# Patient Record
Sex: Female | Born: 1960 | Race: White | Hispanic: No | Smoking: Never smoker
Health system: Southern US, Community
[De-identification: ages and names within clinical notes are randomized; demographics above are authoritative.]

## PROBLEM LIST (undated history)

## (undated) DIAGNOSIS — E119 Type 2 diabetes mellitus without complications: Secondary | ICD-10-CM

## (undated) HISTORY — DX: Type 2 diabetes mellitus without complications: E11.9

---

## 2016-09-26 ENCOUNTER — Encounter: Payer: Self-pay | Admitting: Endocrinology

## 2016-09-26 ENCOUNTER — Ambulatory Visit (INDEPENDENT_AMBULATORY_CARE_PROVIDER_SITE_OTHER): Payer: No Typology Code available for payment source | Admitting: Endocrinology

## 2016-09-26 DIAGNOSIS — E1143 Type 2 diabetes mellitus with diabetic autonomic (poly)neuropathy: Secondary | ICD-10-CM

## 2016-09-26 DIAGNOSIS — Z794 Long term (current) use of insulin: Secondary | ICD-10-CM

## 2016-09-26 DIAGNOSIS — E119 Type 2 diabetes mellitus without complications: Secondary | ICD-10-CM | POA: Insufficient documentation

## 2016-09-26 LAB — POCT GLYCOSYLATED HEMOGLOBIN (HGB A1C): Hemoglobin A1C: 10.8

## 2016-09-26 MED ORDER — INSULIN DETEMIR 100 UNIT/ML FLEXPEN: 20.0000 [IU] | PEN_INJECTOR | Freq: Every day | SUBCUTANEOUS | 11 refills | Status: DC

## 2016-09-26 MED ORDER — NOVOLOG FLEXPEN 100 UNIT/ML ~~LOC~~ SOPN: 18.0000 [IU] | PEN_INJECTOR | Freq: Three times a day (TID) | SUBCUTANEOUS | 11 refills | Status: DC

## 2016-09-26 NOTE — Patient Instructions (Addendum)
good diet and exercise significantly improve the control of your diabetes.  please let me know if you wish to be referred to a dietician.  high blood sugar is very risky to your health.  you should see an eye doctor and dentist every year.  It is very important to get all recommended vaccinations.   Controlling your blood pressure and cholesterol drastically reduces the damage diabetes does to your body.  Those who smoke should quit.  Please discuss these with your doctor.   check your blood sugar twice a day.  vary the time of day when you check, between before the 3 meals, and at bedtime.  also check if you have symptoms of your blood sugar being too high or too low.  please keep a record of the readings and bring it to your next appointment here (or you can bring the meter itself).  You can write it on any piece of paper.  please call us sooner if your blood sugar goes below 70, or if you have a lot of readings over 200.   For now, please change the insulins to the numbers noted below.  Please take these numbers no matter what your blood sugar is.  Please call us in a few days, to tell us how the blood sugar is doing.   Please come back for a follow-up appointment in 3 months.

## 2016-09-26 NOTE — Progress Notes (Signed)
Subjective:    Patient ID: Emma Payne, female    DOB: March 05, 1961, 56 y.o.   MRN: [id number]  HPI pt is referred by Evalee Mutton, FNP, for diabetes.  Pt states DM was dx'ed in 2002; she has moderate neuropathy of the lower extremities; she has associated gastroparesis; she has been on insulin x 1-2 months; pt says her diet and exercise are poor; she has never had GDM (G0), pancreatitis, pancreatic surgery, severe hypoglycemia or DKA.  she takes Levemir 20 units qam, and PRN Novolog (averages approx 12-15 per meal).  she brings a record of her cbg's which I have reviewed today.  It varies from 140-400.  She denies hypoglycemia.   Past Medical History:  Diagnosis Date  . Diabetes (HCC)     No past surgical history on file.  Social History   Social History  . Marital status: N/A    Spouse name: N/A  . Number of children: N/A  . Years of education: N/A   Occupational History  . Not on file.   Social History Main Topics  . Smoking status: Not on file  . Smokeless tobacco: Not on file  . Alcohol use Not on file  . Drug use: Unknown  . Sexual activity: Not on file   Other Topics Concern  . Not on file   Social History Narrative  . No narrative on file    No current outpatient prescriptions on file prior to visit.   No current facility-administered medications on file prior to visit.     Allergies  Allergen Reactions  . Aptiom [Eslicarbazepine]     Causes seizures  . Cedax [Ceftibuten]     Causes seizures  . Prednisone     Seizures  . Vimpat [Lacosamide]     Causes seizures    Family History  Problem Relation Age of Onset  . Diabetes Father    BP 132/84   Pulse 95   Ht  (1.575 m)   Wt 175 lb (79.4 kg)   SpO2 95%   BMI 32.01 kg/m   Review of Systems denies weight loss, blurry vision, chest pain, muscle cramps, excessive diaphoresis, and cold intolerance.  She has intermittent headache, frequent urination, DOE, rhinorrhea, easy bruising, and nausea.   Depression is well-controlled.      Objective:   Physical Exam VS: see vs page GEN: no distress HEAD: head: no deformity eyes: no periorbital swelling, no proptosis external nose and ears are normal mouth: no lesion seen NECK: supple, thyroid is not enlarged CHEST WALL: no deformity LUNGS: clear to auscultation CV: reg rate and rhythm, no murmur ABD: abdomen is soft, nontender.  no hepatosplenomegaly.  not distended.  no hernia.  MUSCULOSKELETAL: muscle bulk and strength are grossly normal.  no obvious joint swelling.  gait is steady with a walker EXTEMITIES: no deformity.  no ulcer on the feet.  feet are of normal color and temp.  no edema PULSES: dorsalis pedis intact bilat.  no carotid bruit NEURO:  cn 2-12 grossly intact.   readily moves all 4's.  sensation is intact to touch on the feet, but decreased from normal SKIN:  Normal texture and temperature.  No rash or suspicious lesion is visible.   NODES:  None palpable at the neck PSYCH: alert, well-oriented.  Does not appear anxious nor depressed.   a1c=10.8%  I have reviewed outside records, and summarized: Pt was noted to have severely elevated a1c, and referred here. Dietary problems were addressed.  Glycemic  control was improved, but still poor      Assessment & Plan:  Insulin-requiring type 2 DM, with polyneropathy: she wants to take multiple daily injections,and I agree.    Obesity, new to me: she declines surgery.    Patient Instructions  good diet and exercise significantly improve the control of your diabetes.  please let me know if you wish to be referred to a dietician.  high blood sugar is very risky to your health.  you should see an eye doctor and dentist every year.  It is very important to get all recommended vaccinations.   Controlling your blood pressure and cholesterol drastically reduces the damage diabetes does to your body.  Those who smoke should quit.  Please discuss these with your doctor.   check your  blood sugar twice a day.  vary the time of day when you check, between before the 3 meals, and at bedtime.  also check if you have symptoms of your blood sugar being too high or too low.  please keep a record of the readings and bring it to your next appointment here (or you can bring the meter itself).  You can write it on any piece of paper.  please call us sooner if your blood sugar goes below 70, or if you have a lot of readings over 200.   For now, please change the insulins to the numbers noted below.  Please take these numbers no matter what your blood sugar is.  Please call us in a few days, to tell us how the blood sugar is doing.   Please come back for a follow-up appointment in 3 months.

## 2016-09-29 ENCOUNTER — Telehealth: Payer: Self-pay | Admitting: Endocrinology

## 2016-09-29 NOTE — Telephone Encounter (Signed)
Please continue the same levemir, and: Change the novolog to 3 times a day (just before each meal): 27-15-27 units. Please call us next week, to tell us how the blood sugar is doing

## 2016-09-29 NOTE — Telephone Encounter (Signed)
Patient is calling concerning her b/s. Please advise

## 2016-09-29 NOTE — Telephone Encounter (Signed)
Patient called to report blood sugar. 09/27/2016:  Before breakfast 208 Evening: 231   09/28/2016: Before breakfast: 329 Before lunch: 157 Before Supper: 131 Before Bed: 268  09/29/2016: Fasting: 297  Please advise, Thanks!

## 2016-09-29 NOTE — Telephone Encounter (Signed)
I contacted the patient and advised of new instructions. She voiced understanding and had no other questions at this time. Patient will call next week to report blood sugar readings.

## 2016-10-03 ENCOUNTER — Telehealth: Payer: Self-pay | Admitting: Endocrinology

## 2016-10-04 ENCOUNTER — Other Ambulatory Visit: Payer: Self-pay

## 2016-10-04 MED ORDER — BD PEN NEEDLE NANO U/F 32G X 4 MM MISC: 4 refills | Status: DC

## 2016-10-05 ENCOUNTER — Other Ambulatory Visit: Payer: Self-pay | Admitting: Endocrinology

## 2016-10-06 NOTE — Telephone Encounter (Signed)
BS readings 04/28 before breakfast 297; before lunch 167; before supper 131; before bedtime 153 4/29 364; 309; 467; 343 4/30 379; 190; 119; 178 5/1 233; 130; 132; 111 5/2 253; 132; 128; 437 5/3 223; 113; 111; 152 5/4 156; 102

## 2016-10-06 NOTE — Telephone Encounter (Signed)
I contacted the patient and advised of MD's instructions via voicemail. Requested a call back if the patient would like to discuss further.  

## 2016-10-06 NOTE — Telephone Encounter (Signed)
Sugars are improving >> continue to check over the weekend and let us know.

## 2016-10-13 NOTE — Telephone Encounter (Signed)
Patient is calling to give you her blood sugars readings. From may 1st. Please call

## 2016-10-19 NOTE — Telephone Encounter (Signed)
Left a vm requesting the patient call back with her blood sugar readings

## 2016-10-25 ENCOUNTER — Telehealth: Payer: Self-pay

## 2016-10-25 NOTE — Telephone Encounter (Signed)
Left a voice mail for the patient to call me back so that I can verify what the units are for Novlog

## 2016-10-25 NOTE — Telephone Encounter (Signed)
Patient reporting Blood Sugar results.  May 20th BF Break 198 BF Lunch 144 BF Supp 161 BF Bed 268 May 21st BF Break 240 BF Lunch 235 BF Supp 135 BF Bed 291 May 22nd BF Break 265 BF Lunch 149 BF Supp 133 BF Bed 130  Thank you,  -LLL

## 2016-10-25 NOTE — Telephone Encounter (Signed)
Please verify novolog is 3 times a day (just before each meal): 27-15-27 units. Then increase to 30-18-30 units.

## 2016-10-26 NOTE — Telephone Encounter (Signed)
Gave the advise to the patient and she stated an understanding

## 2016-11-02 ENCOUNTER — Telehealth: Payer: Self-pay

## 2016-11-02 NOTE — Telephone Encounter (Signed)
Please verify novolog is 18 units 3 times a day (just before each meal). then increase to 3 times a day (just before each meal) 18-18-22 units. Please continue the same levemir. I'll see you next time.

## 2016-11-02 NOTE — Telephone Encounter (Signed)
Monday 28th   Morning  Afternoon  Evening   Night        205        136 140    201    Tuesday 29th    Morning Afternoon Evening   Night        262        169 166  327    Wednesday 30th   Morning Afternoon Evening    Night            269 176      106        203  Thank you,  -LL

## 2016-11-03 NOTE — Telephone Encounter (Signed)
Spoke with the patient and she stated an understanding she will call back if she has any further problems

## 2016-11-09 ENCOUNTER — Other Ambulatory Visit: Payer: Self-pay

## 2016-11-09 MED ORDER — NOVOLOG FLEXPEN 100 UNIT/ML ~~LOC~~ SOPN: 18.0000 [IU] | PEN_INJECTOR | Freq: Three times a day (TID) | SUBCUTANEOUS | 0 refills | Status: DC

## 2016-11-09 NOTE — Telephone Encounter (Signed)
Submitted to pharmacy for 90 day 

## 2016-11-09 NOTE — Telephone Encounter (Signed)
**  Remind patient they can make refill requests via MyChart**  Medication refill request (Name & Dosage):  NOVOLOG FLEXPEN 100 UNIT/ML FlexPen  Preferred pharmacy (Name & Address):   [facility] [place] [place] - [id number]  Other comments (if applicable):    Patient requests 90 day supply so that she is not charged hundreds of dollars.

## 2016-11-10 ENCOUNTER — Telehealth: Payer: Self-pay

## 2016-11-10 NOTE — Telephone Encounter (Signed)
Patient called. States that we sent NOVOLOG FLEXPEN 100 UNIT/ML FlexPen [[id number]][[id number]]   to the wrong pharmacy. [facility] [place] [place]   [facility] [place] [address], [place], [place] [id number]  Please submit today for 90 day supply   Patient is also calling in her BS readings 11/06/2016 -272 -166 -165 -216 11/07/2016 -347 -258 -132 -230 11/08/2016 -211 -170 -159 -148 11/09/2016 -234 -178 -123 -174

## 2016-11-13 MED ORDER — NOVOLOG FLEXPEN 100 UNIT/ML ~~LOC~~ SOPN: 18.0000 [IU] | PEN_INJECTOR | Freq: Three times a day (TID) | SUBCUTANEOUS | 0 refills | Status: DC

## 2016-11-13 NOTE — Telephone Encounter (Signed)
I contacted the patient and she sated the patient has been taking her levemir at night. Please advise, Thanks!

## 2016-11-13 NOTE — Telephone Encounter (Signed)
Patient notified and voiced understanding. Will increase the Levemir to 23 units.

## 2016-11-13 NOTE — Telephone Encounter (Signed)
Message not reviewed until 11/13/2016. [facility] [place] Dr. Elvera LennoxGherghe, Could you review the blood sugar readings during Dr. George HughEllison's absence? Thanks!

## 2016-11-13 NOTE — Telephone Encounter (Signed)
Let's increase the dose by 3 units and if sugars in am are high in 3 days, can increase by 3 more if now lows.

## 2016-11-13 NOTE — Telephone Encounter (Signed)
Is there any way she could move the Levemir at night? As the am CBGs are her highest.  If she can do this, the way to transition towards nighttime Levemir is:  if she already took the Levemir in a.m. this morning,  Tomorrow take it at lunchtime and a after tomorrow take it at night (bedtime).

## 2016-11-23 ENCOUNTER — Telehealth: Payer: Self-pay | Admitting: Endocrinology

## 2016-11-23 NOTE — Telephone Encounter (Signed)
Last 3 days of readings 6/18 before breakfast 279; lunch 222; supper 180; bedtime 233 6/19 268; 180; 165; 163 6/20 213; 274; 269; 258 6/21 222; 211

## 2016-11-24 NOTE — Telephone Encounter (Signed)
Requested a call back from the patient to further discuss.  

## 2016-11-24 NOTE — Telephone Encounter (Signed)
Confirm that the patient is taking 20 units of Levemir once a day This needs to be changed to 15 units twice a day

## 2016-11-24 NOTE — Telephone Encounter (Signed)
Requested a call back from the patient to discuss further.  

## 2016-11-27 NOTE — Telephone Encounter (Signed)
Ok, I need to know specifically how blood sugars are doing on 26 units qam.

## 2016-11-27 NOTE — Telephone Encounter (Signed)
Last 3 days of readings 6/18 before breakfast 279; lunch 222; supper 180; bedtime 233 6/19 268; 180; 165; 163 6/20 213; 274; 269; 258 6/21 222; 211  Patient stated these blood sugars were based off the 26 units of levermir daily.

## 2016-11-27 NOTE — Telephone Encounter (Signed)
Patient returning phone call. Call patient back before 11am.

## 2016-11-27 NOTE — Telephone Encounter (Signed)
OK 

## 2016-11-27 NOTE — Telephone Encounter (Signed)
OK, please increase to 28 units qam I'll see you next time.

## 2016-11-27 NOTE — Telephone Encounter (Signed)
I contacted the patient and she stated she is taking 26 units of the Levemir daily.  Please advise on the adjustments to be made. Thanks!

## 2016-11-27 NOTE — Telephone Encounter (Signed)
See instructions from Dr. Lucianne MussKumar and please advise if anything needs to be changed or added to the instructions.

## 2016-11-28 ENCOUNTER — Telehealth: Payer: Self-pay

## 2016-11-28 NOTE — Telephone Encounter (Signed)
Called and LVM advising patient of MD note to increase insulin, left call back number if any questions.

## 2016-11-28 NOTE — Telephone Encounter (Signed)
Called and LVM advising patient of MD note to increase insulin, left call back number if any questions.   

## 2016-11-30 ENCOUNTER — Telehealth: Payer: Self-pay

## 2016-11-30 NOTE — Telephone Encounter (Signed)
Called patient and advised of the medication and insulin changes. Patient understood and had questions at this time.

## 2016-11-30 NOTE — Telephone Encounter (Signed)
Patient returning call about insulin changes. Would like a call back.  Thank you,  -LL

## 2016-11-30 NOTE — Telephone Encounter (Signed)
Called patient and advised of the medication and insulin changes. Patient understood and had questions at this time. 

## 2016-12-18 ENCOUNTER — Telehealth: Payer: Self-pay

## 2016-12-18 NOTE — Telephone Encounter (Signed)
Patient notified of Levemir dosage increase. She voiced understanding and will increase to 35 units at night. Patient stated she does not take her levemir in the am.

## 2016-12-18 NOTE — Telephone Encounter (Signed)
OK, please increase to 35 units qam I'll see you soon, as scheduled

## 2016-12-18 NOTE — Telephone Encounter (Signed)
Friday July 13th  Breakfast   260    Lunch   172    Dinner   182     Bedtime    149  Saturday July 14th  Breakfast   202    Lunch  180     Dinner    232     Bedtime    117  Sunday July 15th  Breakfast   217    Lunch   162    Dinner   207      Bedtime   147   Thank you,  -LL

## 2016-12-18 NOTE — Telephone Encounter (Signed)
See message and please advise, Thanks!  

## 2016-12-22 ENCOUNTER — Other Ambulatory Visit: Payer: Self-pay

## 2016-12-22 ENCOUNTER — Encounter: Payer: Self-pay | Admitting: Endocrinology

## 2016-12-22 ENCOUNTER — Ambulatory Visit (INDEPENDENT_AMBULATORY_CARE_PROVIDER_SITE_OTHER): Payer: No Typology Code available for payment source | Admitting: Endocrinology

## 2016-12-22 VITALS — BP 126/82 | HR 98 | Ht 62.0 in | Wt 176.0 lb

## 2016-12-22 DIAGNOSIS — Z794 Long term (current) use of insulin: Secondary | ICD-10-CM | POA: Diagnosis not present

## 2016-12-22 DIAGNOSIS — E1143 Type 2 diabetes mellitus with diabetic autonomic (poly)neuropathy: Secondary | ICD-10-CM | POA: Diagnosis not present

## 2016-12-22 MED ORDER — INSULIN PEN NEEDLE 32G X 4 MM MISC: 2 refills | Status: DC

## 2016-12-22 MED ORDER — INSULIN DETEMIR 100 UNIT/ML FLEXPEN: 50.0000 [IU] | PEN_INJECTOR | Freq: Every day | SUBCUTANEOUS | 11 refills | Status: DC

## 2016-12-22 MED ORDER — NOVOLOG FLEXPEN 100 UNIT/ML ~~LOC~~ SOPN: PEN_INJECTOR | SUBCUTANEOUS | 11 refills | Status: DC

## 2016-12-22 MED ORDER — NOVOLOG FLEXPEN 100 UNIT/ML ~~LOC~~ SOPN: PEN_INJECTOR | SUBCUTANEOUS | 3 refills | Status: DC

## 2016-12-22 NOTE — Progress Notes (Signed)
Subjective:    Patient ID: Emma Payne, female    DOB: 10/09/1960, 56 y.o.   MRN: [id number]  HPI  Pt returns for f/u of diabetes mellitus: DM type: Insulin-requiring type 2. Dx'ed: 2002 Complications: polyneuropathy and gastroparesis Therapy: insulin since early 2018 GDM: never (G0) DKA: never Severe hypoglycemia: never Pancreatitis: never Pancreatic imaging:  Other: she takes multiple daily injections; she declines weight loss surgery Interval history: she brings a record of her cbg's which I have reviewed today.  from 147-275.  It is lowest at HS, and highest fasting.  He eats at HS.  pt states she feels well in general. Past Medical History:  Diagnosis Date  . Diabetes (HCC)     No past surgical history on file.  Social History   Social History  . Marital status: Divorced    Spouse name: N/A  . Number of children: N/A  . Years of education: N/A   Occupational History  . Not on file.   Social History Main Topics  . Smoking status: Never Smoker  . Smokeless tobacco: Never Used  . Alcohol use No  . Drug use: Unknown  . Sexual activity: Not on file   Other Topics Concern  . Not on file   Social History Narrative  . No narrative on file    Current Outpatient Prescriptions on File Prior to Visit  Medication Sig Dispense Refill  . albuterol (PROVENTIL HFA;VENTOLIN HFA) 108 (90 Base) MCG/ACT inhaler Inhale 2 puffs into the lungs every 6 (six) hours as needed for wheezing or shortness of breath.    . Ascorbic Acid (VITAMIN C) 1000 MG tablet Take 1,000 mg by mouth.    Marland Kitchen. aspirin 81 MG chewable tablet Chew 81 mg by mouth.    . BD INTEGRA SYRINGE 25G X 1" 3 ML MISC     . Biotin 5000 MCG CAPS Take by mouth.    . carbidopa-levodopa (SINEMET IR) 25-100 MG tablet Take by mouth 4 (four) times daily.     . Cholecalciferol (VITAMIN D3) 10000 units TABS Take by mouth.    . CRANBERRY PO Take 25,200 mg by mouth. 2 tabs in the pm    . cyanocobalamin (,VITAMIN B-12,) 1000  MCG/ML injection Inject into the muscle.    . Cyanocobalamin (VITAMIN B 12 PO) Take 3,000 mcg by mouth.    . Dextromethorphan-Quinidine 20-10 MG CAPS 1 in the am 1 at lunch    . dicyclomine (BENTYL) 10 MG capsule TAKE ONE CAPSULE 4 TIMES A DAY AS NEEDED    . FERROUS SULFATE PO Take 65 mg by mouth. 3 times daily    . lamoTRIgine (LAMICTAL) 200 MG tablet 2 (two) times daily.     . Loratadine 10 MG CAPS Take by mouth.    . magnesium chloride (SLOW-MAG) 64 MG TBEC SR tablet Take by mouth. 3 in the am 2 in the evening    . Pediatric Multiple Vit-C-FA (FLINSTONES GUMMIES OMEGA-3 DHA PO) Take 2 tablets by mouth daily.     . ranitidine (ZANTAC) 150 MG tablet Take 150 mg by mouth 2 (two) times daily.    . rizatriptan (MAXALT-MLT) 10 MG disintegrating tablet     . traZODone (DESYREL) 50 MG tablet Take 50-100 mg by mouth.    . valsartan (DIOVAN) 160 MG tablet Take 80 mg by mouth.    . venlafaxine XR (EFFEXOR-XR) 75 MG 24 hr capsule 3 daily in the am    . neomycin-polymyxin b-dexamethasone (MAXITROL) 3.5-10000-0.1 OINT  No current facility-administered medications on file prior to visit.     Allergies  Allergen Reactions  . Aptiom [Eslicarbazepine]     Causes seizures  . Cedax [Ceftibuten]     Causes vomiting  . Prednisone     Seizures  . Vimpat [Lacosamide]     Causes seizures    Family History  Problem Relation Age of Onset  . Diabetes Father     BP 126/82   Pulse 98   Ht 5\' 2"  (1.575 m)   Wt 176 lb (79.8 kg)   SpO2 91%   BMI 32.19 kg/m   Review of Systems She denies hypoglycemia.    Objective:   Physical Exam VITAL SIGNS:  See vs page GENERAL: no distress Pulses: foot pulses are intact bilaterally.   MSK: no deformity of the feet or ankles.  CV: trace bilat edema of the legs Skin:  no ulcer on the feet or ankles.  normal color and temp on the feet and ankles Neuro: sensation is intact to touch on the feet and ankles, but decreased from nornmal  Lab Results    Component Value Date   HGBA1C 7.5 (H) 12/22/2016      Assessment & Plan:  Insulin-requiring type 2 DM, with gastroparesis: she needs increased rx.  Patient Instructions  check your blood sugar twice a day.  vary the time of day when you check, between before the 3 meals, and at bedtime.  also check if you have symptoms of your blood sugar being too high or too low.  please keep a record of the readings and bring it to your next appointment here (or you can bring the meter itself).  You can write it on any piece of paper.  please call us sooner if your blood sugar goes below 70, or if you have a lot of readings over 200.   Please increase the levemir to 50 units at bedtime, and: Please continue the same novolog. Please call or message [place] next week, to tell us how the blood sugar is doing blood tests are requested for you today.  We'll let you know about the results..   Please come back for a follow-up appointment in 3 months.

## 2016-12-22 NOTE — Patient Instructions (Addendum)
check your blood sugar twice a day.  vary the time of day when you check, between before the 3 meals, and at bedtime.  also check if you have symptoms of your blood sugar being too high or too low.  please keep a record of the readings and bring it to your next appointment here (or you can bring the meter itself).  You can write it on any piece of paper.  please call us sooner if your blood sugar goes below 70, or if you have a lot of readings over 200.   Please increase the levemir to 50 units at bedtime, and: Please continue the same novolog. Please call or message us next week, to tell us how the blood sugar is doing blood tests are requested for you today.  We'll let you know about the results..   Please come back for a follow-up appointment in 3 months.

## 2016-12-23 LAB — HEMOGLOBIN A1C
HEMOGLOBIN A1C: 7.5 % — AB (ref <5.7)
MEAN PLASMA GLUCOSE: 169 mg/dL

## 2017-01-30 ENCOUNTER — Telehealth: Payer: Self-pay | Admitting: Endocrinology

## 2017-01-30 NOTE — Telephone Encounter (Signed)
Please verify levemir is 50 units qhs. Then please increase th 60 units qhs.

## 2017-01-30 NOTE — Telephone Encounter (Signed)
Patient's blood sugar is running 270 and she was told to call the office when this happens. Call patient at [phone] to advise.

## 2017-01-30 NOTE — Telephone Encounter (Signed)
please call patient: I need more information about how cbg's run throughout the day.

## 2017-01-30 NOTE — Telephone Encounter (Signed)
CBG's:  Morning Lunch Dinner Bedtime 8/25: 206  193 175 195 8/26: 260  174 122 147    8/27: 274  175 131 135

## 2017-01-31 NOTE — Telephone Encounter (Signed)
Called patient with new dosage & informed her to let us know how blood sugars are doing in a week.

## 2017-03-05 NOTE — Telephone Encounter (Signed)
Please increase levemir to 65 units qhs Please continue the same novolog Please call or message [place] later this week, to tell us how the blood sugar is doing

## 2017-03-05 NOTE — Telephone Encounter (Signed)
Patient calling to give blood sugars: CBG's:             Morning      Lunch  Dinner  Bedtime 9/28:    253               195      284      191 9/29:    300               128      282      170                               9/30:    311               120      198      228 10/1:    258  Please call patient and advise.

## 2017-03-05 NOTE — Telephone Encounter (Signed)
Called patient and she stated that she would call next week with cbg's.

## 2017-03-21 ENCOUNTER — Ambulatory Visit: Payer: No Typology Code available for payment source | Admitting: Endocrinology

## 2017-04-03 ENCOUNTER — Telehealth: Payer: Self-pay | Admitting: Endocrinology

## 2017-04-03 NOTE — Telephone Encounter (Signed)
I called patient & she stated that PCP had recommended her checking into V GO 30. She was fine waiting until appt to discuss.

## 2017-04-03 NOTE — Telephone Encounter (Signed)
Let's discuss at upcoming OV

## 2017-04-03 NOTE — Telephone Encounter (Signed)
Pt is wondering if she could be a candidate for the vgo 30?

## 2017-04-13 ENCOUNTER — Ambulatory Visit: Payer: No Typology Code available for payment source | Admitting: Endocrinology

## 2017-05-08 ENCOUNTER — Encounter: Payer: Self-pay | Admitting: Endocrinology

## 2017-05-08 ENCOUNTER — Ambulatory Visit: Payer: No Typology Code available for payment source | Admitting: Endocrinology

## 2017-05-08 VITALS — BP 138/82 | HR 103 | Wt 181.8 lb

## 2017-05-08 DIAGNOSIS — Z794 Long term (current) use of insulin: Secondary | ICD-10-CM | POA: Diagnosis not present

## 2017-05-08 DIAGNOSIS — E1143 Type 2 diabetes mellitus with diabetic autonomic (poly)neuropathy: Secondary | ICD-10-CM

## 2017-05-08 LAB — POCT GLYCOSYLATED HEMOGLOBIN (HGB A1C): HEMOGLOBIN A1C: 7.9

## 2017-05-08 MED ORDER — INSULIN DETEMIR 100 UNIT/ML FLEXPEN: 75.0000 [IU] | PEN_INJECTOR | Freq: Every day | SUBCUTANEOUS | 11 refills | Status: DC

## 2017-05-08 MED ORDER — NOVOLOG FLEXPEN 100 UNIT/ML ~~LOC~~ SOPN: 18.0000 [IU] | PEN_INJECTOR | Freq: Three times a day (TID) | SUBCUTANEOUS | 3 refills | Status: DC

## 2017-05-08 MED ORDER — INSULIN PEN NEEDLE 32G X 4 MM MISC: 1.0000 | Freq: Four times a day (QID) | 2 refills | Status: DC

## 2017-05-08 NOTE — Progress Notes (Signed)
Subjective:    Patient ID: Emma Payne, female    DOB: 03/06/1961, 56 y.o.   MRN: [id number]  HPI Pt returns for f/u of diabetes mellitus: DM type: Insulin-requiring type 2. Dx'ed: 2002 Complications: polyneuropathy and gastroparesis Therapy: insulin since early 2018 GDM: never (G0) DKA: never Severe hypoglycemia: never.  Pancreatitis: never Pancreatic imaging: never Other: she takes multiple daily injections; she declines weight loss surgery.   Interval history: she brings a record of her cbg's which I have reviewed today.  from 83-300.  It is still lowest at HS, and highest fasting.  He eats at HS.  pt states she feels well in general. Past Medical History:  Diagnosis Date  . Diabetes (HCC)     History reviewed. No pertinent surgical history.  Social History   Socioeconomic History  . Marital status: Divorced    Spouse name: Not on file  . Number of children: Not on file  . Years of education: Not on file  . Highest education level: Not on file  Social Needs  . Financial resource strain: Not on file  . Food insecurity - worry: Not on file  . Food insecurity - inability: Not on file  . Transportation needs - medical: Not on file  . Transportation needs - non-medical: Not on file  Occupational History  . Not on file  Tobacco Use  . Smoking status: Never Smoker  . Smokeless tobacco: Never Used  Substance and Sexual Activity  . Alcohol use: No  . Drug use: Not on file  . Sexual activity: Not on file  Other Topics Concern  . Not on file  Social History Narrative  . Not on file    Current Outpatient Medications on File Prior to Visit  Medication Sig Dispense Refill  . acetaZOLAMIDE (DIAMOX) 250 MG tablet Take 250 mg by mouth 3 (three) times daily. Take 1/2 tablet at bedtime    . albuterol (PROVENTIL HFA;VENTOLIN HFA) 108 (90 Base) MCG/ACT inhaler Inhale 2 puffs into the lungs every 6 (six) hours as needed for wheezing or shortness of breath.    . Ascorbic Acid  (VITAMIN C) 1000 MG tablet Take 1,000 mg by mouth.    Marland Kitchen. aspirin 81 MG chewable tablet Chew 81 mg by mouth.    . BD INTEGRA SYRINGE 25G X 1" 3 ML MISC     . Biotin 5000 MCG CAPS Take by mouth.    . carbidopa-levodopa (SINEMET IR) 25-100 MG tablet Take by mouth 4 (four) times daily.     . Cholecalciferol (VITAMIN D3) 10000 units TABS Take by mouth.    . CRANBERRY PO Take 25,200 mg by mouth. 2 tabs in the pm    . cyanocobalamin (,VITAMIN B-12,) 1000 MCG/ML injection Inject into the muscle.    . Cyanocobalamin (VITAMIN B 12 PO) Take 3,000 mcg by mouth.    . Dextromethorphan-Quinidine 20-10 MG CAPS 1 in the am 1 at lunch    . dicyclomine (BENTYL) 10 MG capsule TAKE ONE CAPSULE 4 TIMES A DAY AS NEEDED    . FERROUS SULFATE PO Take 65 mg by mouth. 3 times daily    . Krill Oil 500 MG CAPS Take by mouth. 1 at night    . lamoTRIgine (LAMICTAL) 200 MG tablet 2 (two) times daily.     Marland Kitchen. levETIRAcetam (KEPPRA) 250 MG tablet Take 250 mg by mouth 2 (two) times daily.    . Loratadine 10 MG CAPS Take by mouth.    . magnesium chloride (  SLOW-MAG) 64 MG TBEC SR tablet Take by mouth. 3 in the am 2 in the evening    . meloxicam (MOBIC) 7.5 MG tablet Take 7.5 mg by mouth daily. 1 in the am    . neomycin-polymyxin b-dexamethasone (MAXITROL) 3.5-10000-0.1 OINT     . Pediatric Multiple Vit-C-FA (FLINSTONES GUMMIES OMEGA-3 DHA PO) Take 2 tablets by mouth daily.     . ranitidine (ZANTAC) 150 MG tablet Take 150 mg by mouth 2 (two) times daily.    . rizatriptan (MAXALT-MLT) 10 MG disintegrating tablet     . tiZANidine (ZANAFLEX) 4 MG capsule Take 4 mg by mouth 2 (two) times daily.    . traZODone (DESYREL) 50 MG tablet Take 50-100 mg by mouth.    . valsartan (DIOVAN) 160 MG tablet Take 80 mg by mouth.    . venlafaxine XR (EFFEXOR-XR) 75 MG 24 hr capsule 3 daily in the am     No current facility-administered medications on file prior to visit.     Allergies  Allergen Reactions  . Aptiom [Eslicarbazepine]     Causes  seizures  . Cedax [Ceftibuten]     Causes vomiting  . Prednisone     Seizures  . Vimpat [Lacosamide]     Causes seizures    Family History  Problem Relation Age of Onset  . Diabetes Father     BP 138/82 (BP Location: Left Arm, Patient Position: Sitting, Cuff Size: Normal)   Pulse (!) 103   Wt 181 lb 12.8 oz (82.5 kg)   SpO2 93%   BMI 33.25 kg/m    Review of Systems She denies hypoglycemia    Objective:   Physical Exam VITAL SIGNS:  See vs page GENERAL: no distress Pulses: foot pulses are intact bilaterally.   MSK: no deformity of the feet or ankles.  CV: no edema of the legs or ankles Skin:  no ulcer on the feet or ankles.  normal color and temp on the feet and ankles Neuro: sensation is intact to touch on the feet and ankles.     Lab Results  Component Value Date   HGBA1C 7.9 05/08/2017      Assessment & Plan:  Insulin-requiring type 2 DM: The pattern of his cbg's indicates she needs some adjustment in his therapy   Patient Instructions  check your blood sugar twice a day.  vary the time of day when you check, between before the 3 meals, and at bedtime.  also check if you have symptoms of your blood sugar being too high or too low.  please keep a record of the readings and bring it to your next appointment here (or you can bring the meter itself).  You can write it on any piece of paper.  please call us sooner if your blood sugar goes below 70, or if you have a lot of readings over 200.   Please increase the levemir to 75 units at bedtime, and:  Please reduce the novolog to 18 units 3 times a day (just before each meal). Please see Bonita QuinLinda, to consider an insulin pump and continuous glucose monitor.  Please come back for a follow-up appointment in 3 months.

## 2017-05-08 NOTE — Patient Instructions (Addendum)
check your blood sugar twice a day.  vary the time of day when you check, between before the 3 meals, and at bedtime.  also check if you have symptoms of your blood sugar being too high or too low.  please keep a record of the readings and bring it to your next appointment here (or you can bring the meter itself).  You can write it on any piece of paper.  please call us sooner if your blood sugar goes below 70, or if you have a lot of readings over 200.   Please increase the levemir to 75 units at bedtime, and:  Please reduce the novolog to 18 units 3 times a day (just before each meal). Please see Emma Payne, to consider an insulin pump and continuous glucose monitor.  Please come back for a follow-up appointment in 3 months.

## 2017-05-23 ENCOUNTER — Encounter: Payer: No Typology Code available for payment source | Attending: Endocrinology | Admitting: Nutrition

## 2017-05-23 DIAGNOSIS — E119 Type 2 diabetes mellitus without complications: Secondary | ICD-10-CM

## 2017-05-23 DIAGNOSIS — Z794 Long term (current) use of insulin: Principal | ICD-10-CM

## 2017-05-24 NOTE — Patient Instructions (Signed)
Review all materials given, and go on web sites for more information. Call if questions.

## 2017-05-24 NOTE — Progress Notes (Signed)
Patient is here with her significant other. The want information on CGM,and insulin pumps,  We discussed pump therapy--advantages and disadvantages, and they were shown the 3 models.  We discussed the advantages of each mode. Pt. Says she has the beginning of dementia, and asked my oppinion, as to what pump would be best for her.  They were given brochures on each model and told to review and call me with there decision. Questions were answered about cost and sites for infusion sets. They had no final questions.

## 2017-08-07 ENCOUNTER — Other Ambulatory Visit: Payer: Self-pay | Admitting: Endocrinology

## 2017-12-10 ENCOUNTER — Other Ambulatory Visit: Payer: Self-pay | Admitting: Endocrinology

## 2018-01-27 ENCOUNTER — Other Ambulatory Visit: Payer: Self-pay | Admitting: Endocrinology

## 2018-01-27 ENCOUNTER — Encounter: Payer: Self-pay | Admitting: Endocrinology

## 2018-01-27 NOTE — Telephone Encounter (Signed)
Please refill x 1 Ov is due  

## 2018-01-30 ENCOUNTER — Ambulatory Visit (INDEPENDENT_AMBULATORY_CARE_PROVIDER_SITE_OTHER): Payer: No Typology Code available for payment source | Admitting: Endocrinology

## 2018-01-30 ENCOUNTER — Encounter: Payer: Self-pay | Admitting: Endocrinology

## 2018-01-30 ENCOUNTER — Other Ambulatory Visit: Payer: Self-pay

## 2018-01-30 VITALS — BP 140/80 | HR 104 | Ht 62.0 in | Wt 179.2 lb

## 2018-01-30 DIAGNOSIS — E1143 Type 2 diabetes mellitus with diabetic autonomic (poly)neuropathy: Secondary | ICD-10-CM

## 2018-01-30 DIAGNOSIS — Z23 Encounter for immunization: Secondary | ICD-10-CM

## 2018-01-30 DIAGNOSIS — Z794 Long term (current) use of insulin: Secondary | ICD-10-CM

## 2018-01-30 LAB — POCT GLYCOSYLATED HEMOGLOBIN (HGB A1C): Hemoglobin A1C: 10.3 % — AB (ref 4.0–5.6)

## 2018-01-30 LAB — HM DIABETES EYE EXAM

## 2018-01-30 MED ORDER — INSULIN DETEMIR 100 UNIT/ML FLEXPEN: 90.0000 [IU] | PEN_INJECTOR | Freq: Every day | SUBCUTANEOUS | 0 refills | Status: DC

## 2018-01-30 MED ORDER — INSULIN DETEMIR 100 UNIT/ML FLEXPEN: PEN_INJECTOR | SUBCUTANEOUS | 0 refills | Status: DC

## 2018-01-30 MED ORDER — NOVOLOG FLEXPEN 100 UNIT/ML ~~LOC~~ SOPN: PEN_INJECTOR | SUBCUTANEOUS | 3 refills | Status: DC

## 2018-01-30 MED ORDER — INSULIN PEN NEEDLE 32G X 4 MM MISC: 1.0000 | Freq: Four times a day (QID) | 2 refills | Status: DC

## 2018-01-30 NOTE — Progress Notes (Signed)
Subjective:    Patient ID: Emma Payne, female    DOB: [birth year], 57 y.o.   MRN: [id number]  HPI Pt returns for f/u of diabetes mellitus: DM type: Insulin-requiring type 2. Dx'ed: 2002 Complications: polyneuropathy and gastroparesis Therapy: insulin since early 2018 GDM: never (G0) DKA: never Severe hypoglycemia: never.  Pancreatitis: never Pancreatic imaging: never Other: she takes multiple daily injections; she declines weight loss surgery; she declines insulin pump and continuous glucose monitor Interval history: she recently regained her insurance. she brings a record of her cbg's which I have reviewed today.  It varies from 102-500.  It is lowest in the afternoon, and highest fasting (higher than at HS).  pt states she feels well in general.  Pt says she seldom misses the insulin.  No recent steroids.  Past Medical History:  Diagnosis Date  . Diabetes (HCC)     No past surgical history on file.  Social History   Socioeconomic History  . Marital status: Divorced    Spouse name: Not on file  . Number of children: Not on file  . Years of education: Not on file  . Highest education level: Not on file  Occupational History  . Not on file  Social Needs  . Financial resource strain: Not on file  . Food insecurity:    Worry: Not on file    Inability: Not on file  . Transportation needs:    Medical: Not on file    Non-medical: Not on file  Tobacco Use  . Smoking status: Never Smoker  . Smokeless tobacco: Never Used  Substance and Sexual Activity  . Alcohol use: No  . Drug use: Not on file  . Sexual activity: Not on file  Lifestyle  . Physical activity:    Days per week: Not on file    Minutes per session: Not on file  . Stress: Not on file  Relationships  . Social connections:    Talks on phone: Not on file    Gets together: Not on file    Attends religious service: Not on file    Active member of club or organization: Not on file    Attends meetings of  clubs or organizations: Not on file    Relationship status: Not on file  . Intimate partner violence:    Fear of current or ex partner: Not on file    Emotionally abused: Not on file    Physically abused: Not on file    Forced sexual activity: Not on file  Other Topics Concern  . Not on file  Social History Narrative  . Not on file    Current Outpatient Medications on File Prior to Visit  Medication Sig Dispense Refill  . acetaZOLAMIDE (DIAMOX) 250 MG tablet Take 250 mg by mouth 3 (three) times daily. Take 1/2 tablet at bedtime    . albuterol (PROVENTIL HFA;VENTOLIN HFA) 108 (90 Base) MCG/ACT inhaler Inhale 2 puffs into the lungs every 6 (six) hours as needed for wheezing or shortness of breath.    . Ascorbic Acid (VITAMIN C) 1000 MG tablet Take 1,000 mg by mouth.    Marland Kitchen aspirin 81 MG chewable tablet Chew 81 mg by mouth.    . BD INTEGRA SYRINGE 25G X 1" 3 ML MISC     . Biotin 5000 MCG CAPS Take by mouth.    . carbidopa-levodopa (SINEMET IR) 25-100 MG tablet Take by mouth 4 (four) times daily.     . Cholecalciferol (VITAMIN D3) 10000  units TABS Take by mouth.    . CRANBERRY PO Take 25,200 mg by mouth. 2 tabs in the pm    . cyanocobalamin (,VITAMIN B-12,) 1000 MCG/ML injection Inject into the muscle.    . Cyanocobalamin (VITAMIN B 12 PO) Take 3,000 mcg by mouth.    . Dextromethorphan-Quinidine 20-10 MG CAPS 1 in the am 1 at lunch    . dicyclomine (BENTYL) 10 MG capsule TAKE ONE CAPSULE 4 TIMES A DAY AS NEEDED    . erythromycin (E-MYCIN) 250 MG tablet Take by mouth.    Di Kindle. FERROUS SULFATE PO Take 65 mg by mouth. 3 times daily    . irbesartan (AVAPRO) 150 MG tablet Take 150 mg by mouth daily.  0  . ketoconazole (NIZORAL) 2 % cream Apply topically.    Boris Lown. Krill Oil 500 MG CAPS Take by mouth. 1 at night    . lamoTRIgine (LAMICTAL) 200 MG tablet 2 (two) times daily.     Marland Kitchen. levETIRAcetam (KEPPRA) 500 MG tablet TAKE 2 TABLETS IN THE AM, HALF A TABLET AT NOON, HALF A TABLET AT 4PM, 2 TABLETS AT  BEDTIME  5  . Loratadine 10 MG CAPS Take by mouth.    . magnesium chloride (SLOW-MAG) 64 MG TBEC SR tablet Take by mouth. 3 in the am 2 in the evening    . meloxicam (MOBIC) 7.5 MG tablet Take 7.5 mg by mouth daily. 1 in the am    . montelukast (SINGULAIR) 10 MG tablet Take 10 mg by mouth daily.  1  . neomycin-polymyxin b-dexamethasone (MAXITROL) 3.5-10000-0.1 OINT     . Pediatric Multiple Vit-C-FA (FLINSTONES GUMMIES OMEGA-3 DHA PO) Take 2 tablets by mouth daily.     . Polyethylene Glycol 3350 (PEG 3350) POWD Take 17 g by mouth.    . primidone (MYSOLINE) 50 MG tablet 3 TABLET 2 TIMES A DAY FOR 90 DAYS  0  . promethazine (PHENERGAN) 25 MG tablet Take by mouth.    . QUEtiapine (SEROQUEL) 25 MG tablet Take by mouth.    . ranitidine (ZANTAC) 150 MG tablet Take 150 mg by mouth 2 (two) times daily.    . rizatriptan (MAXALT-MLT) 10 MG disintegrating tablet     . tiZANidine (ZANAFLEX) 4 MG capsule Take 4 mg by mouth 2 (two) times daily.    Marland Kitchen. venlafaxine XR (EFFEXOR-XR) 150 MG 24 hr capsule TAKE 2 CAPSULES IN THE MORNING  3   No current facility-administered medications on file prior to visit.     Allergies  Allergen Reactions  . Aptiom [Eslicarbazepine]     Causes seizures  . Cedax [Ceftibuten]     Causes vomiting  . Prednisone     Seizures  . Vimpat [Lacosamide]     Causes seizures    Family History  Problem Relation Age of Onset  . Diabetes Father     BP 140/80 (BP Location: Left Arm, Patient Position: Sitting, Cuff Size: Normal)   Pulse (!) 104   Ht 5\' 2"  (1.575 m)   Wt 179 lb 3.2 oz (81.3 kg)   SpO2 96%   BMI 32.78 kg/m    Review of Systems She denies hypoglycemia.  She has lost 2 lbs since last ov    Objective:   Physical Exam VITAL SIGNS:  See vs page GENERAL: no distress Pulses: dorsalis pedis intact bilat.   MSK: no deformity of the feet CV: no leg edema Skin:  no ulcer on the feet.  normal color and temp on the feet.  Neuro: sensation is intact to touch on the  feet, but decreased from normal.      Lab Results  Component Value Date   HGBA1C 10.3 (A) 01/30/2018       Assessment & Plan:  Insulin-requiring type 2 DM, with polyneuropathy: she needs increased rx  Patient Instructions  check your blood sugar twice a day.  vary the time of day when you check, between before the 3 meals, and at bedtime.  also check if you have symptoms of your blood sugar being too high or too low.  please keep a record of the readings and bring it to your next appointment here (or you can bring the meter itself).  You can write it on any piece of paper.  please call us sooner if your blood sugar goes below 70, or if you have a lot of readings over 200.   Please increase the insulins to the numbers listed below.   Please come back for a follow-up appointment in 2 months.

## 2018-01-30 NOTE — Patient Instructions (Addendum)
check your blood sugar twice a day.  vary the time of day when you check, between before the 3 meals, and at bedtime.  also check if you have symptoms of your blood sugar being too high or too low.  please keep a record of the readings and bring it to your next appointment here (or you can bring the meter itself).  You can write it on any piece of paper.  please call us sooner if your blood sugar goes below 70, or if you have a lot of readings over 200.   Please increase the insulins to the numbers listed below.  Please come back for a follow-up appointment in 2 months.   

## 2018-02-05 ENCOUNTER — Other Ambulatory Visit: Payer: Self-pay | Admitting: Endocrinology

## 2018-03-19 ENCOUNTER — Encounter: Payer: Self-pay | Admitting: Endocrinology

## 2018-04-01 ENCOUNTER — Encounter: Payer: Self-pay | Admitting: Endocrinology

## 2018-04-05 ENCOUNTER — Ambulatory Visit: Payer: No Typology Code available for payment source | Admitting: Endocrinology

## 2018-04-05 ENCOUNTER — Encounter: Payer: Self-pay | Admitting: Endocrinology

## 2018-04-05 VITALS — BP 152/88 | HR 86 | Ht 62.0 in | Wt 178.4 lb

## 2018-04-05 DIAGNOSIS — E1143 Type 2 diabetes mellitus with diabetic autonomic (poly)neuropathy: Secondary | ICD-10-CM

## 2018-04-05 DIAGNOSIS — Z794 Long term (current) use of insulin: Secondary | ICD-10-CM | POA: Diagnosis not present

## 2018-04-05 LAB — POCT GLYCOSYLATED HEMOGLOBIN (HGB A1C): Hemoglobin A1C: 8.7 % — AB (ref 4.0–5.6)

## 2018-04-05 MED ORDER — NOVOLOG FLEXPEN 100 UNIT/ML ~~LOC~~ SOPN: PEN_INJECTOR | SUBCUTANEOUS | 3 refills | Status: DC

## 2018-04-05 MED ORDER — INSULIN PEN NEEDLE 32G X 4 MM MISC: 1.0000 | Freq: Four times a day (QID) | 2 refills | Status: DC

## 2018-04-05 MED ORDER — INSULIN DETEMIR 100 UNIT/ML FLEXPEN: 105.0000 [IU] | PEN_INJECTOR | Freq: Every day | SUBCUTANEOUS | 3 refills | Status: DC

## 2018-04-05 NOTE — Progress Notes (Signed)
Subjective:    Patient ID: Emma Payne, female    DOB: 1961-01-09, 57 y.o.   MRN: [id number]  HPI Pt returns for f/u of diabetes mellitus: DM type: Insulin-requiring type 2. Dx'ed: 2002 Complications: polyneuropathy and gastroparesis Therapy: insulin since early 2018 GDM: never (G0) DKA: never Severe hypoglycemia: never.  Pancreatitis: never Pancreatic imaging: never Other: she takes multiple daily injections; she declines weight loss surgery; she declines insulin pump and continuous glucose monitor Interval history: she brings a record of her cbg's which I have reviewed today.  It varies from 121-321.  She does not know why cbg's have increased.  No recent steroids.  She takes novolog 3 times a day (just before each meal), 32-28-34 units, and levemir, 100 units qhs.  It is in general higher at breakfast and at supper.   Past Medical History:  Diagnosis Date  . Diabetes (HCC)     No past surgical history on file.  Social History   Socioeconomic History  . Marital status: Divorced    Spouse name: Not on file  . Number of children: Not on file  . Years of education: Not on file  . Highest education level: Not on file  Occupational History  . Not on file  Social Needs  . Financial resource strain: Not on file  . Food insecurity:    Worry: Not on file    Inability: Not on file  . Transportation needs:    Medical: Not on file    Non-medical: Not on file  Tobacco Use  . Smoking status: Never Smoker  . Smokeless tobacco: Never Used  Substance and Sexual Activity  . Alcohol use: No  . Drug use: Not on file  . Sexual activity: Not on file  Lifestyle  . Physical activity:    Days per week: Not on file    Minutes per session: Not on file  . Stress: Not on file  Relationships  . Social connections:    Talks on phone: Not on file    Gets together: Not on file    Attends religious service: Not on file    Active member of club or organization: Not on file    Attends  meetings of clubs or organizations: Not on file    Relationship status: Not on file  . Intimate partner violence:    Fear of current or ex partner: Not on file    Emotionally abused: Not on file    Physically abused: Not on file    Forced sexual activity: Not on file  Other Topics Concern  . Not on file  Social History Narrative  . Not on file    Current Outpatient Medications on File Prior to Visit  Medication Sig Dispense Refill  . acetaZOLAMIDE (DIAMOX) 250 MG tablet Take 250 mg by mouth 3 (three) times daily. Take 1 tablet at bedtime    . albuterol (PROVENTIL HFA;VENTOLIN HFA) 108 (90 Base) MCG/ACT inhaler Inhale 2 puffs into the lungs every 6 (six) hours as needed for wheezing or shortness of breath.    . Ascorbic Acid (VITAMIN C) 1000 MG tablet Take 1,000 mg by mouth.    Marland Kitchen aspirin 81 MG chewable tablet Chew 81 mg by mouth.    . BD INTEGRA SYRINGE 25G X 1" 3 ML MISC     . Biotin 5000 MCG CAPS Take by mouth.    . carbidopa-levodopa (SINEMET IR) 25-100 MG tablet Take by mouth 3 (three) times daily.     Marland Kitchen  Cholecalciferol (VITAMIN D3) 10000 units TABS Take by mouth.    . CRANBERRY PO Take 25,200 mg by mouth. 2 tabs in the pm    . cyanocobalamin (,VITAMIN B-12,) 1000 MCG/ML injection Inject into the muscle.    . Cyanocobalamin (VITAMIN B 12 PO) Take 3,000 mcg by mouth.    . Dextromethorphan-Quinidine 20-10 MG CAPS 1 in the am 1 at lunch    . dicyclomine (BENTYL) 10 MG capsule TAKE ONE CAPSULE 4 TIMES A DAY AS NEEDED    . erythromycin (E-MYCIN) 250 MG tablet Take by mouth.    Di Kindle SULFATE PO Take 65 mg by mouth. 3 times daily    . irbesartan (AVAPRO) 150 MG tablet Take 150 mg by mouth daily.  0  . ketoconazole (NIZORAL) 2 % cream Apply topically.    Boris Lown Oil 500 MG CAPS Take by mouth. 1 at night    . lamoTRIgine (LAMICTAL) 200 MG tablet 2 (two) times daily.     Marland Kitchen levETIRAcetam (KEPPRA) 500 MG tablet TAKE 2 TABLETS IN THE AM, HALF A TABLET AT NOON, HALF A TABLET AT 4PM, 2  TABLETS AT BEDTIME  5  . magnesium chloride (SLOW-MAG) 64 MG TBEC SR tablet Take by mouth. 3 in the am 2 in the evening    . meloxicam (MOBIC) 7.5 MG tablet Take 7.5 mg by mouth daily. 1 in the am    . montelukast (SINGULAIR) 10 MG tablet Take 10 mg by mouth daily.  1  . neomycin-polymyxin b-dexamethasone (MAXITROL) 3.5-10000-0.1 OINT     . Pediatric Multiple Vit-C-FA (FLINSTONES GUMMIES OMEGA-3 DHA PO) Take 2 tablets by mouth daily.     . Polyethylene Glycol 3350 (PEG 3350) POWD Take 17 g by mouth.    . primidone (MYSOLINE) 50 MG tablet 2 (two) times daily.   0  . promethazine (PHENERGAN) 25 MG tablet Take by mouth.    . ranitidine (ZANTAC) 150 MG tablet Take 150 mg by mouth daily.     . rizatriptan (MAXALT-MLT) 10 MG disintegrating tablet     . tiZANidine (ZANAFLEX) 4 MG capsule Take 4 mg by mouth 2 (two) times daily.    Marland Kitchen venlafaxine XR (EFFEXOR-XR) 150 MG 24 hr capsule 75 mg daily with breakfast.   3  . Loratadine 10 MG CAPS Take by mouth.    . QUEtiapine (SEROQUEL) 25 MG tablet Take by mouth.     No current facility-administered medications on file prior to visit.     Allergies  Allergen Reactions  . Aptiom [Eslicarbazepine]     Causes seizures  . Cedax [Ceftibuten]     Causes vomiting  . Prednisone     Seizures  . Vimpat [Lacosamide]     Causes seizures    Family History  Problem Relation Age of Onset  . Diabetes Father     BP (!) 152/88 (BP Location: Right Arm, Patient Position: Sitting, Cuff Size: Normal)   Pulse 86   Ht 5\' 2"  (1.575 m)   Wt 178 lb 6.4 oz (80.9 kg)   SpO2 96%   BMI 32.63 kg/m    Review of Systems She denies hypoglycemia.  No weight change    Objective:   Physical Exam VITAL SIGNS:  See vs page GENERAL: no distress Pulses: foot pulses are intact bilaterally.   MSK: no deformity of the feet or ankles.  CV: no edema of the legs or ankles Skin:  no ulcer on the feet or ankles.  normal color and temp  on the feet and ankles Neuro: sensation  is intact to touch on the feet and ankles, but decreased from normal.   Lab Results  Component Value Date   HGBA1C 8.7 (A) 04/05/2018       Assessment & Plan:  Insulin-requiring type 2 DM, with polyneuropathy: worse HTN: is noted today  Patient Instructions  Your blood pressure is high today.  Please see your primary care provider soon, to have it rechecked check your blood sugar twice a day.  vary the time of day when you check, between before the 3 meals, and at bedtime.  also check if you have symptoms of your blood sugar being too high or too low.  please keep a record of the readings and bring it to your next appointment here (or you can bring the meter itself).  You can write it on any piece of paper.  please call us sooner if your blood sugar goes below 70, or if you have a lot of readings over 200.   Please increase the insulins to the numbers listed below.   Please come back for a follow-up appointment in 2-3 months.

## 2018-04-05 NOTE — Patient Instructions (Addendum)
Your blood pressure is high today.  Please see your primary care provider soon, to have it rechecked check your blood sugar twice a day.  vary the time of day when you check, between before the 3 meals, and at bedtime.  also check if you have symptoms of your blood sugar being too high or too low.  please keep a record of the readings and bring it to your next appointment here (or you can bring the meter itself).  You can write it on any piece of paper.  please call us sooner if your blood sugar goes below 70, or if you have a lot of readings over 200.   Please increase the insulins to the numbers listed below.   Please come back for a follow-up appointment in 2-3 months.

## 2018-06-11 ENCOUNTER — Ambulatory Visit: Payer: No Typology Code available for payment source | Admitting: Endocrinology

## 2018-06-12 ENCOUNTER — Ambulatory Visit: Payer: No Typology Code available for payment source | Admitting: Endocrinology

## 2018-07-09 ENCOUNTER — Ambulatory Visit: Payer: No Typology Code available for payment source | Admitting: Endocrinology

## 2018-08-26 ENCOUNTER — Ambulatory Visit: Payer: No Typology Code available for payment source | Admitting: Endocrinology

## 2018-11-07 ENCOUNTER — Telehealth: Payer: Self-pay

## 2018-11-07 NOTE — Telephone Encounter (Signed)
LOV 04/05/18. Per Dr. Everardo All, f/u in 2-3 mo. Called pt to schedule appt. LVM requesting returned call.

## 2018-11-13 ENCOUNTER — Other Ambulatory Visit: Payer: Self-pay

## 2018-11-15 ENCOUNTER — Ambulatory Visit (INDEPENDENT_AMBULATORY_CARE_PROVIDER_SITE_OTHER): Payer: No Typology Code available for payment source | Admitting: Endocrinology

## 2018-11-15 ENCOUNTER — Ambulatory Visit: Payer: No Typology Code available for payment source | Admitting: Endocrinology

## 2018-11-15 ENCOUNTER — Other Ambulatory Visit: Payer: Self-pay

## 2018-11-15 DIAGNOSIS — E612 Magnesium deficiency: Secondary | ICD-10-CM | POA: Insufficient documentation

## 2018-11-15 DIAGNOSIS — Z794 Long term (current) use of insulin: Secondary | ICD-10-CM

## 2018-11-15 DIAGNOSIS — E1143 Type 2 diabetes mellitus with diabetic autonomic (poly)neuropathy: Secondary | ICD-10-CM | POA: Diagnosis not present

## 2018-11-15 DIAGNOSIS — E559 Vitamin D deficiency, unspecified: Secondary | ICD-10-CM

## 2018-11-15 DIAGNOSIS — D649 Anemia, unspecified: Secondary | ICD-10-CM | POA: Insufficient documentation

## 2018-11-15 DIAGNOSIS — E538 Deficiency of other specified B group vitamins: Secondary | ICD-10-CM

## 2018-11-15 NOTE — Progress Notes (Signed)
Subjective:    Patient ID: Emma Payne, female    DOB: 01/30/1961, 58 y.o.   MRN: [id number]  HPI telehealth visit today via doxy video visit.  Alternatives to telehealth are presented to this patient, and the patient agrees to the telehealth visit. Pt is advised of the cost of the visit, and agrees to this, also.   Patient is in her car, and I am at the office.   Persons attending the telehealth visit: the patient and I.   Pt returns for f/u of diabetes mellitus: DM type: Insulin-requiring type 2. Dx'ed: 2002 Complications: polyneuropathy and gastroparesis Therapy: insulin since early 2018 GDM: never (G0) DKA: never Severe hypoglycemia: never.  Pancreatitis: never Pancreatic imaging: never Other: she takes multiple daily injections; she declines weight loss surgery; she declines insulin pump and continuous glucose monitor.  Interval history: She says cbg varies from 110-200.  She says cbg is highest fasting, and lowest at HS.  pt states she feels better in general, since she fell this am.  No signif injury.   She requests labs for other vit deficiencies she has.   Past Medical History:  Diagnosis Date  . Diabetes ([facility])     No past surgical history on file.  Social History   Socioeconomic History  . Marital status: Divorced    Spouse name: Not on file  . Number of children: Not on file  . Years of education: Not on file  . Highest education level: Not on file  Occupational History  . Not on file  Social Needs  . Financial resource strain: Not on file  . Food insecurity    Worry: Not on file    Inability: Not on file  . Transportation needs    Medical: Not on file    Non-medical: Not on file  Tobacco Use  . Smoking status: Never Smoker  . Smokeless tobacco: Never Used  Substance and Sexual Activity  . Alcohol use: No  . Drug use: Not on file  . Sexual activity: Not on file  Lifestyle  . Physical activity    Days per week: Not on file    Minutes per  session: Not on file  . Stress: Not on file  Relationships  . Social Musicianconnections    Talks on phone: Not on file    Gets together: Not on file    Attends religious service: Not on file    Active member of club or organization: Not on file    Attends meetings of clubs or organizations: Not on file    Relationship status: Not on file  . Intimate partner violence    Fear of current or ex partner: Not on file    Emotionally abused: Not on file    Physically abused: Not on file    Forced sexual activity: Not on file  Other Topics Concern  . Not on file  Social History Narrative  . Not on file    Current Outpatient Medications on File Prior to Visit  Medication Sig Dispense Refill  . acetaZOLAMIDE (DIAMOX) 250 MG tablet Take 250 mg by mouth 3 (three) times daily. Take 1 tablet at bedtime    . albuterol (PROVENTIL HFA;VENTOLIN HFA) 108 (90 Base) MCG/ACT inhaler Inhale 2 puffs into the lungs every 6 (six) hours as needed for wheezing or shortness of breath.    . Ascorbic Acid (VITAMIN C) 1000 MG tablet Take 1,000 mg by mouth.    Marland Kitchen. aspirin 81 MG chewable tablet Chew  81 mg by mouth.    . BD INTEGRA SYRINGE 25G X 1" 3 ML MISC     . Biotin 5000 MCG CAPS Take by mouth.    . carbidopa-levodopa (SINEMET IR) 25-100 MG tablet Take by mouth 3 (three) times daily.     . Cholecalciferol (VITAMIN D3) 10000 units TABS Take by mouth.    . CRANBERRY PO Take 25,200 mg by mouth. 2 tabs in the pm    . cyanocobalamin (,VITAMIN B-12,) 1000 MCG/ML injection Inject into the muscle.    . Cyanocobalamin (VITAMIN B 12 PO) Take 3,000 mcg by mouth.    . Dextromethorphan-Quinidine 20-10 MG CAPS 1 in the am 1 at lunch    . dicyclomine (BENTYL) 10 MG capsule TAKE ONE CAPSULE 4 TIMES A DAY AS NEEDED    . erythromycin (E-MYCIN) 250 MG tablet Take by mouth.    Di Kindle. FERROUS SULFATE PO Take 65 mg by mouth. 3 times daily    . Insulin Detemir (LEVEMIR FLEXTOUCH) 100 UNIT/ML Pen Inject 105 Units into the skin at bedtime. 40 pen  3  . Insulin Pen Needle (BD PEN NEEDLE NANO U/F) 32G X 4 MM MISC 1 Device by Other route 4 (four) times daily. 400 each 2  . irbesartan (AVAPRO) 150 MG tablet Take 150 mg by mouth daily.  0  . ketoconazole (NIZORAL) 2 % cream Apply topically.    Boris Lown. Krill Oil 500 MG CAPS Take by mouth. 1 at night    . lamoTRIgine (LAMICTAL) 200 MG tablet 2 (two) times daily.     Marland Kitchen. levETIRAcetam (KEPPRA) 500 MG tablet TAKE 2 TABLETS IN THE AM, HALF A TABLET AT NOON, HALF A TABLET AT 4PM, 2 TABLETS AT BEDTIME  5  . Loratadine 10 MG CAPS Take by mouth.    . magnesium chloride (SLOW-MAG) 64 MG TBEC SR tablet Take by mouth. 3 in the am 2 in the evening    . meloxicam (MOBIC) 7.5 MG tablet Take 7.5 mg by mouth daily. 1 in the am    . montelukast (SINGULAIR) 10 MG tablet Take 10 mg by mouth daily.  1  . neomycin-polymyxin b-dexamethasone (MAXITROL) 3.5-10000-0.1 OINT     . NOVOLOG FLEXPEN 100 UNIT/ML FlexPen 3 times a day (just before each meal) 32-33-34 units. 35 pen 3  . Pediatric Multiple Vit-C-FA (FLINSTONES GUMMIES OMEGA-3 DHA PO) Take 2 tablets by mouth daily.     . Polyethylene Glycol 3350 (PEG 3350) POWD Take 17 g by mouth.    . primidone (MYSOLINE) 50 MG tablet 2 (two) times daily.   0  . promethazine (PHENERGAN) 25 MG tablet Take by mouth.    . QUEtiapine (SEROQUEL) 25 MG tablet Take by mouth.    . ranitidine (ZANTAC) 150 MG tablet Take 150 mg by mouth daily.     . rizatriptan (MAXALT-MLT) 10 MG disintegrating tablet     . tiZANidine (ZANAFLEX) 4 MG capsule Take 4 mg by mouth 2 (two) times daily.    Marland Kitchen. venlafaxine XR (EFFEXOR-XR) 150 MG 24 hr capsule 75 mg daily with breakfast.   3   No current facility-administered medications on file prior to visit.     Allergies  Allergen Reactions  . Aptiom [Eslicarbazepine]     Causes seizures  . Cedax [Ceftibuten]     Causes vomiting  . Prednisone     Seizures  . Vimpat [Lacosamide]     Causes seizures    Family History  Problem Relation Age of Onset  .  Diabetes Father    Review of Systems She denies hypoglycemia.     Objective:   Physical Exam      Assessment & Plan:  Mult vit/mineral deficiencies: check at pt's request Insulin-requiring type 2 DM, with PN: Based on the pattern of her cbg's, she needs some adjustment in her therapy.  We'll check a1c first Anemia, as reported by pt.  I told pt this is different from the other deficiencies.  She needs f/u for this with primary care provider  Patient Instructions  Blood tests are requested for you today.  Please do at [facility] let you know about the results.  check your blood sugar twice a day.  vary the time of day when you check, between before the 3 meals, and at bedtime.  also check if you have symptoms of your blood sugar being too high or too low.  please keep a record of the readings and bring it to your next appointment here (or you can bring the meter itself).  You can write it on any piece of paper.  please call us sooner if your blood sugar goes below 70, or if you have a lot of readings over 200.   If you are anemic, it is very important to make sure with your primary care provider that this is not something serious. Please come back for a follow-up appointment in 2-3 months.

## 2018-11-15 NOTE — Patient Instructions (Addendum)
Blood tests are requested for you today.  Please do at [facility] let you know about the results.  check your blood sugar twice a day.  vary the time of day when you check, between before the 3 meals, and at bedtime.  also check if you have symptoms of your blood sugar being too high or too low.  please keep a record of the readings and bring it to your next appointment here (or you can bring the meter itself).  You can write it on any piece of paper.  please call us sooner if your blood sugar goes below 70, or if you have a lot of readings over 200.   If you are anemic, it is very important to make sure with your primary care provider that this is not something serious. Please come back for a follow-up appointment in 2-3 months.

## 2019-01-17 ENCOUNTER — Other Ambulatory Visit: Payer: Self-pay

## 2019-01-17 DIAGNOSIS — E612 Magnesium deficiency: Secondary | ICD-10-CM

## 2019-01-17 DIAGNOSIS — E1143 Type 2 diabetes mellitus with diabetic autonomic (poly)neuropathy: Secondary | ICD-10-CM

## 2019-01-17 DIAGNOSIS — E559 Vitamin D deficiency, unspecified: Secondary | ICD-10-CM

## 2019-01-17 DIAGNOSIS — E538 Deficiency of other specified B group vitamins: Secondary | ICD-10-CM

## 2019-01-18 LAB — HEMOGLOBIN A1C
Hgb A1c MFr Bld: 8.9 % of total Hgb — ABNORMAL HIGH (ref <5.7)
Mean Plasma Glucose: 209 (calc)
eAG (mmol/L): 11.6 (calc)

## 2019-01-18 LAB — IRON,TIBC AND FERRITIN PANEL
%SAT: 22 % (calc) (ref 16–45)
Ferritin: 702 ng/mL — ABNORMAL HIGH (ref 16–232)
Iron: 58 ug/dL (ref 45–160)
TIBC: 267 mcg/dL (calc) (ref 250–450)

## 2019-01-18 LAB — BASIC METABOLIC PANEL
BUN: 17 mg/dL (ref 7–25)
CO2: 23 mmol/L (ref 20–32)
Calcium: 9.8 mg/dL (ref 8.6–10.4)
Chloride: 107 mmol/L (ref 98–110)
Creat: 0.77 mg/dL (ref 0.50–0.99)
Glucose, Bld: 137 mg/dL — ABNORMAL HIGH (ref 65–99)
Potassium: 4.1 mmol/L (ref 3.5–5.3)
Sodium: 139 mmol/L (ref 135–146)

## 2019-01-18 LAB — TSH: TSH: 2.17 mIU/L (ref 0.40–4.50)

## 2019-01-18 LAB — VITAMIN B12: Vitamin B-12: 713 pg/mL (ref 200–1100)

## 2019-01-18 LAB — VITAMIN D 25 HYDROXY (VIT D DEFICIENCY, FRACTURES): Vit D, 25-Hydroxy: 15 ng/mL — ABNORMAL LOW (ref 30–100)

## 2019-01-18 LAB — MAGNESIUM: Magnesium: 1.6 mg/dL (ref 1.5–2.5)

## 2019-01-30 ENCOUNTER — Other Ambulatory Visit: Payer: Self-pay | Admitting: Endocrinology

## 2019-02-11 ENCOUNTER — Encounter: Payer: Self-pay | Admitting: Endocrinology

## 2019-02-12 ENCOUNTER — Other Ambulatory Visit: Payer: Self-pay | Admitting: Endocrinology

## 2019-02-12 MED ORDER — FARXIGA 10 MG PO TABS: 10.0000 mg | ORAL_TABLET | Freq: Every day | ORAL | 11 refills | Status: DC

## 2019-02-13 ENCOUNTER — Other Ambulatory Visit: Payer: Self-pay | Admitting: Endocrinology

## 2019-02-13 MED ORDER — LEVEMIR FLEXTOUCH 100 UNIT/ML ~~LOC~~ SOPN: 130.0000 [IU] | PEN_INJECTOR | SUBCUTANEOUS | 3 refills | Status: DC

## 2019-03-20 ENCOUNTER — Other Ambulatory Visit: Payer: Self-pay

## 2019-03-24 ENCOUNTER — Other Ambulatory Visit: Payer: Self-pay

## 2019-03-24 ENCOUNTER — Ambulatory Visit: Payer: No Typology Code available for payment source | Admitting: Endocrinology

## 2019-03-24 ENCOUNTER — Encounter: Payer: Self-pay | Admitting: Endocrinology

## 2019-03-24 VITALS — BP 150/70 | HR 95 | Ht 62.0 in | Wt 185.2 lb

## 2019-03-24 DIAGNOSIS — Z794 Long term (current) use of insulin: Secondary | ICD-10-CM

## 2019-03-24 DIAGNOSIS — E1143 Type 2 diabetes mellitus with diabetic autonomic (poly)neuropathy: Secondary | ICD-10-CM | POA: Diagnosis not present

## 2019-03-24 LAB — POCT GLYCOSYLATED HEMOGLOBIN (HGB A1C): Hemoglobin A1C: 7.9 % — AB (ref 4.0–5.6)

## 2019-03-24 MED ORDER — BD PEN NEEDLE NANO U/F 32G X 4 MM MISC: 1.0000 | Freq: Four times a day (QID) | 3 refills | Status: DC

## 2019-03-24 MED ORDER — LEVEMIR FLEXTOUCH 100 UNIT/ML ~~LOC~~ SOPN: 130.0000 [IU] | PEN_INJECTOR | Freq: Every day | SUBCUTANEOUS | 3 refills | Status: DC

## 2019-03-24 NOTE — Patient Instructions (Addendum)
Your blood pressure is high today.  Please see your primary care provider soon, to have it rechecked.   Please change the Levemir to bedtime check your blood sugar twice a day.  vary the time of day when you check, between before the 3 meals, and at bedtime.  also check if you have symptoms of your blood sugar being too high or too low.  please keep a record of the readings and bring it to your next appointment here (or you can bring the meter itself).  You can write it on any piece of paper.  please call us sooner if your blood sugar goes below 70, or if you have a lot of readings over 200.   Please come back for a follow-up appointment in 3 months.

## 2019-03-24 NOTE — Progress Notes (Signed)
Subjective:    Patient ID: Emma Payne, female    DOB: March 06, 1961, 58 y.o.   MRN: [id number]  HPI Pt returns for f/u of diabetes mellitus: DM type: Insulin-requiring type 2. Dx'ed: 2774 Complications: polyneuropathy and gastroparesis Therapy: insulin since early 2018 GDM: never (G0) DKA: never Severe hypoglycemia: never.  Pancreatitis: never Pancreatic imaging: never Other: she takes multiple daily injections; she declines weight loss surgery; she declines insulin pump and continuous glucose monitor; she did not tolerate [place] or Invokana (yeast infect) Interval history: she brings a record of her cbg's which I have reviewed today.  cbg varies from 68-321.  She says cbg is highest fasting, and lowest in the afternoon.  pt states she feels well in general.   Past Medical History:  Diagnosis Date  . Diabetes ([place])     No past surgical history on file.  Social History   Socioeconomic History  . Marital status: Divorced    Spouse name: Not on file  . Number of children: Not on file  . Years of education: Not on file  . Highest education level: Not on file  Occupational History  . Not on file  Social Needs  . Financial resource strain: Not on file  . Food insecurity    Worry: Not on file    Inability: Not on file  . Transportation needs    Medical: Not on file    Non-medical: Not on file  Tobacco Use  . Smoking status: Never Smoker  . Smokeless tobacco: Never Used  Substance and Sexual Activity  . Alcohol use: No  . Drug use: Not on file  . Sexual activity: Not on file  Lifestyle  . Physical activity    Days per week: Not on file    Minutes per session: Not on file  . Stress: Not on file  Relationships  . Social Herbalist on phone: Not on file    Gets together: Not on file    Attends religious service: Not on file    Active member of club or organization: Not on file    Attends meetings of clubs or organizations: Not on file    Relationship  status: Not on file  . Intimate partner violence    Fear of current or ex partner: Not on file    Emotionally abused: Not on file    Physically abused: Not on file    Forced sexual activity: Not on file  Other Topics Concern  . Not on file  Social History Narrative  . Not on file    Current Outpatient Medications on File Prior to Visit  Medication Sig Dispense Refill  . acetaZOLAMIDE (DIAMOX) 250 MG tablet Take 250 mg by mouth 3 (three) times daily. Take 1 tablet at bedtime    . albuterol (PROVENTIL HFA;VENTOLIN HFA) 108 (90 Base) MCG/ACT inhaler Inhale 2 puffs into the lungs every 6 (six) hours as needed for wheezing or shortness of breath.    . Ascorbic Acid (VITAMIN C) 1000 MG tablet Take 1,000 mg by mouth.    Marland Kitchen aspirin 81 MG chewable tablet Chew 81 mg by mouth.    . BD INTEGRA SYRINGE 25G X 1" 3 ML MISC     . Biotin 5000 MCG CAPS Take by mouth.    . carbidopa-levodopa (SINEMET IR) 25-100 MG tablet Take by mouth 3 (three) times daily.     . Cholecalciferol (VITAMIN D3) 10000 units TABS Take by mouth.    Marland Kitchen  CRANBERRY PO Take 25,200 mg by mouth. 2 tabs in the pm    . cyanocobalamin (,VITAMIN B-12,) 1000 MCG/ML injection Inject into the muscle.    . Cyanocobalamin (VITAMIN B 12 PO) Take 3,000 mcg by mouth.    . Dextromethorphan-Quinidine 20-10 MG CAPS 1 in the am 1 at lunch    . dicyclomine (BENTYL) 10 MG capsule TAKE ONE CAPSULE 4 TIMES A DAY AS NEEDED    . erythromycin (E-MYCIN) 250 MG tablet Take by mouth.    Di Kindle SULFATE PO Take 65 mg by mouth. 3 times daily    . irbesartan (AVAPRO) 150 MG tablet Take 150 mg by mouth daily.  0  . ketoconazole (NIZORAL) 2 % cream Apply topically.    Boris Lown Oil 500 MG CAPS Take by mouth. 1 at night    . lamoTRIgine (LAMICTAL) 200 MG tablet 2 (two) times daily.     Marland Kitchen levETIRAcetam (KEPPRA) 500 MG tablet TAKE 2 TABLETS IN THE AM, HALF A TABLET AT NOON, HALF A TABLET AT 4PM, 2 TABLETS AT BEDTIME  5  . Loratadine 10 MG CAPS Take by mouth.    .  magnesium chloride (SLOW-MAG) 64 MG TBEC SR tablet Take by mouth. 3 in the am 2 in the evening    . meloxicam (MOBIC) 7.5 MG tablet Take 7.5 mg by mouth daily. 1 in the am    . montelukast (SINGULAIR) 10 MG tablet Take 10 mg by mouth daily.  1  . neomycin-polymyxin b-dexamethasone (MAXITROL) 3.5-10000-0.1 OINT     . NOVOLOG FLEXPEN 100 UNIT/ML FlexPen 3 times a day (just before each meal) 32-33-34 units. 35 pen 3  . Pediatric Multiple Vit-C-FA (FLINSTONES GUMMIES OMEGA-3 DHA PO) Take 2 tablets by mouth daily.     . Polyethylene Glycol 3350 (PEG 3350) POWD Take 17 g by mouth.    . primidone (MYSOLINE) 50 MG tablet 2 (two) times daily.   0  . promethazine (PHENERGAN) 25 MG tablet Take by mouth.    . QUEtiapine (SEROQUEL) 25 MG tablet Take by mouth.    . ranitidine (ZANTAC) 150 MG tablet Take 150 mg by mouth daily.     . rizatriptan (MAXALT-MLT) 10 MG disintegrating tablet     . tiZANidine (ZANAFLEX) 4 MG capsule Take 4 mg by mouth 2 (two) times daily.    Marland Kitchen venlafaxine XR (EFFEXOR-XR) 150 MG 24 hr capsule 75 mg daily with breakfast.   3   No current facility-administered medications on file prior to visit.     Allergies  Allergen Reactions  . Aptiom [Eslicarbazepine]     Causes seizures  . Cedax [Ceftibuten]     Causes vomiting  . Prednisone     Seizures  . Vimpat [Lacosamide]     Causes seizures    Family History  Problem Relation Age of Onset  . Diabetes Father     BP (!) 150/70 (BP Location: Left Arm, Patient Position: Sitting, Cuff Size: Large)   Pulse 95   Ht 5\' 2"  (1.575 m)   Wt 185 lb 3.2 oz (84 kg)   SpO2 98%   BMI 33.87 kg/m    Review of Systems Toe numbness is much better on folate.      Objective:   Physical Exam VITAL SIGNS:  See vs page GENERAL: no distress Pulses: dorsalis pedis intact bilat.   MSK: no deformity of the feet CV: no leg edema Skin:  no ulcer on the feet, but there are heavy calluses.  normal color and temp on the feet. Neuro: sensation  is intact to touch on the feet, but decreased from normal.   Lab Results  Component Value Date   HGBA1C 7.9 (A) 03/24/2019       Assessment & Plan:  HTN: is noted today Insulin-requiring type 2 DM, with PN: she needs increased rx, if it can be done with a regimen that avoids or minimizes hypoglycemia.  Hypoglycemia: this limits aggressiveness of glycemic control   Patient Instructions  Your blood pressure is high today.  Please see your primary care provider soon, to have it rechecked.   Please change the Levemir to bedtime check your blood sugar twice a day.  vary the time of day when you check, between before the 3 meals, and at bedtime.  also check if you have symptoms of your blood sugar being too high or too low.  please keep a record of the readings and bring it to your next appointment here (or you can bring the meter itself).  You can write it on any piece of paper.  please call us sooner if your blood sugar goes below 70, or if you have a lot of readings over 200.   Please come back for a follow-up appointment in 3 months.

## 2019-04-21 ENCOUNTER — Other Ambulatory Visit: Payer: Self-pay | Admitting: Endocrinology

## 2019-05-08 ENCOUNTER — Other Ambulatory Visit: Payer: Self-pay

## 2019-05-08 DIAGNOSIS — Z794 Long term (current) use of insulin: Secondary | ICD-10-CM

## 2019-05-08 DIAGNOSIS — E1143 Type 2 diabetes mellitus with diabetic autonomic (poly)neuropathy: Secondary | ICD-10-CM

## 2019-05-08 MED ORDER — LEVEMIR FLEXTOUCH 100 UNIT/ML ~~LOC~~ SOPN: 130.0000 [IU] | PEN_INJECTOR | Freq: Every day | SUBCUTANEOUS | 0 refills | Status: DC

## 2019-05-09 ENCOUNTER — Other Ambulatory Visit: Payer: Self-pay | Admitting: Endocrinology

## 2019-05-16 ENCOUNTER — Other Ambulatory Visit: Payer: Self-pay

## 2019-05-16 ENCOUNTER — Telehealth: Payer: Self-pay | Admitting: Endocrinology

## 2019-05-16 DIAGNOSIS — Z794 Long term (current) use of insulin: Secondary | ICD-10-CM

## 2019-05-16 MED ORDER — NOVOLOG FLEXPEN 100 UNIT/ML ~~LOC~~ SOPN: PEN_INJECTOR | SUBCUTANEOUS | 0 refills | Status: DC

## 2019-05-16 NOTE — Telephone Encounter (Signed)
NOVOLOG FLEXPEN 100 UNIT/ML FlexPen 90 mL 0 05/16/2019    Sig: INJECT 3 TIMES A DAY (JUST BEFORE EACH MEAL) 32-33-34 UNITS.   Sent to pharmacy as: NOVOLOG FLEXPEN 100 UNIT/ML FlexPen   E-Prescribing Status: Receipt confirmed by pharmacy (05/16/2019  8:37 AM EST)

## 2019-05-16 NOTE — Telephone Encounter (Signed)
MEDICATION: Novolog Flexpen  PHARMACY:  [facility] on [address] A 90 DAY SUPPLY :  Patient would like 90 day instead of current 30 day  IS PATIENT OUT OF MEDICATION:   IF NOT; HOW MUCH IS LEFT:   LAST APPOINTMENT DATE: @12 /09/2018  NEXT APPOINTMENT DATE:@1 /20/2021  DO WE HAVE YOUR PERMISSION TO LEAVE A DETAILED MESSAGE: yes  OTHER COMMENTS:    **Let patient know to contact pharmacy at the end of the day to make sure medication is ready. **  ** Please notify patient to allow 48-72 hours to process**  **Encourage patient to contact the pharmacy for refills or they can request refills through [facility]**

## 2019-06-25 ENCOUNTER — Ambulatory Visit: Payer: No Typology Code available for payment source | Admitting: Endocrinology

## 2019-07-22 ENCOUNTER — Encounter: Payer: Self-pay | Admitting: Endocrinology

## 2019-07-22 ENCOUNTER — Ambulatory Visit: Payer: No Typology Code available for payment source | Admitting: Endocrinology

## 2019-07-22 ENCOUNTER — Other Ambulatory Visit: Payer: Self-pay

## 2019-07-22 VITALS — BP 130/88 | HR 100 | Ht 62.0 in | Wt 181.0 lb

## 2019-07-22 DIAGNOSIS — E1143 Type 2 diabetes mellitus with diabetic autonomic (poly)neuropathy: Secondary | ICD-10-CM | POA: Diagnosis not present

## 2019-07-22 DIAGNOSIS — R300 Dysuria: Secondary | ICD-10-CM | POA: Diagnosis not present

## 2019-07-22 DIAGNOSIS — Z794 Long term (current) use of insulin: Secondary | ICD-10-CM | POA: Diagnosis not present

## 2019-07-22 LAB — POCT GLYCOSYLATED HEMOGLOBIN (HGB A1C): Hemoglobin A1C: 8.6 % — AB (ref 4.0–5.6)

## 2019-07-22 MED ORDER — NITROFURANTOIN MACROCRYSTAL 100 MG PO CAPS: 100.0000 mg | ORAL_CAPSULE | Freq: Three times a day (TID) | ORAL | 0 refills | Status: DC

## 2019-07-22 MED ORDER — NOVOLOG FLEXPEN 100 UNIT/ML ~~LOC~~ SOPN: PEN_INJECTOR | SUBCUTANEOUS | 3 refills | Status: DC

## 2019-07-22 MED ORDER — LEVEMIR FLEXTOUCH 100 UNIT/ML ~~LOC~~ SOPN: 135.0000 [IU] | PEN_INJECTOR | Freq: Every day | SUBCUTANEOUS | 3 refills | Status: DC

## 2019-07-22 NOTE — Progress Notes (Signed)
Subjective:    Patient ID: Emma Payne, female    DOB: 03/30/1961, 59 y.o.   MRN: [id number]  HPI Pt returns for f/u of diabetes mellitus: DM type: Insulin-requiring type 2. Dx'ed: 3976 Complications: polyneuropathy and gastroparesis Therapy: insulin since early 2018 GDM: never (G0) DKA: never Severe hypoglycemia: never.  Pancreatitis: never Pancreatic imaging: never Other: she takes multiple daily injections; she declines weight loss surgery; she declines insulin pump and continuous glucose monitor; she did not tolerate [place] or Invokana (yeast infect).   Interval history: she brings a record of her cbg's which I have reviewed today.  cbg varies from 98-283.  cbg is highest fasting, and lowest in the afternoon.  pt states she feels well in general, except for anxiety.  She says she does not miss the insulin. Past Medical History:  Diagnosis Date  . Diabetes ([place])     No past surgical history on file.  Social History   Socioeconomic History  . Marital status: Divorced    Spouse name: Not on file  . Number of children: Not on file  . Years of education: Not on file  . Highest education level: Not on file  Occupational History  . Not on file  Tobacco Use  . Smoking status: Never Smoker  . Smokeless tobacco: Never Used  Substance and Sexual Activity  . Alcohol use: No  . Drug use: Not on file  . Sexual activity: Not on file  Other Topics Concern  . Not on file  Social History Narrative  . Not on file   Social Determinants of Health   Financial Resource Strain:   . Difficulty of Paying Living Expenses: Not on file  Food Insecurity:   . Worried About Charity fundraiser in the Last Year: Not on file  . Ran Out of Food in the Last Year: Not on file  Transportation Needs:   . Lack of Transportation (Medical): Not on file  . Lack of Transportation (Non-Medical): Not on file  Physical Activity:   . Days of Exercise per Week: Not on file  . Minutes of Exercise  per Session: Not on file  Stress:   . Feeling of Stress : Not on file  Social Connections:   . Frequency of Communication with Friends and Family: Not on file  . Frequency of Social Gatherings with Friends and Family: Not on file  . Attends Religious Services: Not on file  . Active Member of Clubs or Organizations: Not on file  . Attends Archivist Meetings: Not on file  . Marital Status: Not on file  Intimate Partner Violence:   . Fear of Current or Ex-Partner: Not on file  . Emotionally Abused: Not on file  . Physically Abused: Not on file  . Sexually Abused: Not on file    Current Outpatient Medications on File Prior to Visit  Medication Sig Dispense Refill  . acetaZOLAMIDE (DIAMOX) 250 MG tablet Take 250 mg by mouth 3 (three) times daily. Take 1 tablet at bedtime    . albuterol (PROVENTIL HFA;VENTOLIN HFA) 108 (90 Base) MCG/ACT inhaler Inhale 2 puffs into the lungs every 6 (six) hours as needed for wheezing or shortness of breath.    . Ascorbic Acid (VITAMIN C) 1000 MG tablet Take 1,000 mg by mouth.    Marland Kitchen aspirin 81 MG chewable tablet Chew 81 mg by mouth.    . BD INTEGRA SYRINGE 25G X 1" 3 ML MISC     . carbidopa-levodopa (SINEMET  IR) 25-100 MG tablet Take by mouth 3 (three) times daily.     . Cholecalciferol (VITAMIN D3) 10000 units TABS Take by mouth.    . CRANBERRY PO Take 25,200 mg by mouth. 2 tabs in the pm    . cyanocobalamin (,VITAMIN B-12,) 1000 MCG/ML injection Inject into the muscle.    . Cyanocobalamin (VITAMIN B 12 PO) Take 3,000 mcg by mouth.    . Dextromethorphan-Quinidine 20-10 MG CAPS 1 in the am 1 at lunch    . dicyclomine (BENTYL) 10 MG capsule TAKE ONE CAPSULE 4 TIMES A DAY AS NEEDED    . erythromycin (E-MYCIN) 250 MG tablet Take by mouth.    Di Kindle SULFATE PO Take 65 mg by mouth. 3 times daily    . Insulin Pen Needle (BD PEN NEEDLE NANO U/F) 32G X 4 MM MISC 1 each by Other route daily. 30 each 2  . irbesartan (AVAPRO) 150 MG tablet Take 150 mg by  mouth daily.  0  . ketoconazole (NIZORAL) 2 % cream Apply topically.    Boris Lown Oil 500 MG CAPS Take by mouth. 1 at night    . lamoTRIgine (LAMICTAL) 200 MG tablet 2 (two) times daily.     Marland Kitchen levETIRAcetam (KEPPRA) 500 MG tablet TAKE 2 TABLETS IN THE AM, HALF A TABLET AT NOON, HALF A TABLET AT 4PM, 2 TABLETS AT BEDTIME  5  . Loratadine 10 MG CAPS Take by mouth.    . magnesium chloride (SLOW-MAG) 64 MG TBEC SR tablet Take by mouth. 3 in the am 2 in the evening    . meloxicam (MOBIC) 7.5 MG tablet Take 7.5 mg by mouth daily. 1 in the am    . montelukast (SINGULAIR) 10 MG tablet Take 10 mg by mouth daily.  1  . neomycin-polymyxin b-dexamethasone (MAXITROL) 3.5-10000-0.1 OINT     . Polyethylene Glycol 3350 (PEG 3350) POWD Take 17 g by mouth.    . primidone (MYSOLINE) 50 MG tablet 2 (two) times daily.   0  . promethazine (PHENERGAN) 25 MG tablet Take by mouth.    . QUEtiapine (SEROQUEL) 25 MG tablet Take by mouth.    . ranitidine (ZANTAC) 150 MG tablet Take 150 mg by mouth daily.     . rizatriptan (MAXALT-MLT) 10 MG disintegrating tablet     . tiZANidine (ZANAFLEX) 4 MG capsule Take 4 mg by mouth 2 (two) times daily.     No current facility-administered medications on file prior to visit.    Allergies  Allergen Reactions  . Aptiom [Eslicarbazepine]     Causes seizures  . Cedax [Ceftibuten]     Causes vomiting  . Prednisone     Seizures  . Vimpat [Lacosamide]     Causes seizures    Family History  Problem Relation Age of Onset  . Diabetes Father     BP 130/88 (BP Location: Right Arm, Patient Position: Sitting, Cuff Size: Large)   Pulse 100   Ht 5\' 2"  (1.575 m)   Wt 181 lb (82.1 kg)   SpO2 98%   BMI 33.11 kg/m    Review of Systems She denies hypoglycemia.  She has dysuria, but no fever.      Objective:   Physical Exam VITAL SIGNS:  See vs page GENERAL: no distress Pulses: dorsalis pedis intact bilat.   MSK: no deformity of the feet CV: no leg edema Skin:  no ulcer on  the feet.  normal color and temp on the feet. Neuro: sensation  is intact to touch on the feet, but decreased from normal  Lab Results  Component Value Date   HGBA1C 8.6 (A) 07/22/2019   UA is pos for UTI    Assessment & Plan:  Insulin-requiring type 2 DM, with PN: worse UTI, new.   Patient Instructions  Please increase the insulins to the numbers listed below.   check your blood sugar twice a day.  vary the time of day when you check, between before the 3 meals, and at bedtime.  also check if you have symptoms of your blood sugar being too high or too low.  please keep a record of the readings and bring it to your next appointment here (or you can bring the meter itself).  You can write it on any piece of paper.  please call us sooner if your blood sugar goes below 70, or if you have a lot of readings over 200.   Please come back for a follow-up appointment in 2 months.   A urine test is requested for you today.  We'll let you know about the results.  I have sent a prescription to your pharmacy, for an antibiotic pill

## 2019-07-22 NOTE — Patient Instructions (Addendum)
Please increase the insulins to the numbers listed below.   check your blood sugar twice a day.  vary the time of day when you check, between before the 3 meals, and at bedtime.  also check if you have symptoms of your blood sugar being too high or too low.  please keep a record of the readings and bring it to your next appointment here (or you can bring the meter itself).  You can write it on any piece of paper.  please call us sooner if your blood sugar goes below 70, or if you have a lot of readings over 200.   Please come back for a follow-up appointment in 2 months.   A urine test is requested for you today.  We'll let you know about the results.  I have sent a prescription to your pharmacy, for an antibiotic pill

## 2019-07-23 LAB — URINALYSIS, ROUTINE W REFLEX MICROSCOPIC
Bilirubin Urine: NEGATIVE
Hgb urine dipstick: NEGATIVE
Ketones, ur: NEGATIVE
Nitrite: POSITIVE — AB
RBC / HPF: NONE SEEN (ref 0–?)
Specific Gravity, Urine: 1.025 (ref 1.000–1.030)
Urine Glucose: NEGATIVE
Urobilinogen, UA: 0.2 (ref 0.0–1.0)
pH: 6 (ref 5.0–8.0)

## 2019-07-24 LAB — CULTURE, URINE COMPREHENSIVE
MICRO NUMBER:: 10155936
SPECIMEN QUALITY:: ADEQUATE

## 2019-07-25 ENCOUNTER — Other Ambulatory Visit: Payer: Self-pay | Admitting: Endocrinology

## 2019-07-25 ENCOUNTER — Encounter: Payer: Self-pay | Admitting: Endocrinology

## 2019-07-25 MED ORDER — PEN NEEDLES 31G X 5 MM MISC: 1.0000 | Freq: Four times a day (QID) | 11 refills | Status: DC

## 2019-07-28 ENCOUNTER — Encounter: Payer: Self-pay | Admitting: Endocrinology

## 2019-07-28 ENCOUNTER — Other Ambulatory Visit: Payer: Self-pay | Admitting: Endocrinology

## 2019-07-28 MED ORDER — PEN NEEDLES 31G X 5 MM MISC: 1.0000 | 3 refills | Status: DC

## 2019-07-30 ENCOUNTER — Telehealth: Payer: Self-pay | Admitting: Endocrinology

## 2019-07-30 NOTE — Telephone Encounter (Signed)
Patient is having trouble getting her RX's for the items listed below and requests new RX's for the following:  MEDICATION: Insulin Detemir (LEVEMIR FLEXTOUCH) 100 UNIT/ML Pen AND Insulin Pen Needle (PEN NEEDLES) 31G X 5 MM MISC  PHARMACY:   [id number] - [place], [place] - [address] Phone:  [phone]  Fax:  [phone]      IS THIS A 90 DAY SUPPLY : Yes-Levemire must be written for 90 day RX/ Pen Needles-patient needs 5 boxes per RX due to Pharmacy will not break a box (because Pharm only sells 100 pen needles per box)  IS PATIENT OUT OF MEDICATION: No  IF NOT; HOW MUCH IS LEFT: Approx. 3 days left  LAST APPOINTMENT DATE: @2 /16/2021  NEXT APPOINTMENT DATE:@4 /16/2021  DO WE HAVE YOUR PERMISSION TO LEAVE A DETAILED MESSAGE: Yes  OTHER COMMENTS:    **Let patient know to contact pharmacy at the end of the day to make sure medication is ready. **  ** Please notify patient to allow 48-72 hours to process**  **Encourage patient to contact the pharmacy for refills or they can request refills through [facility]**

## 2019-07-30 NOTE — Telephone Encounter (Signed)
Called pharmacist and spoke with her directly regarding pt concerns listed below. States pen needles are ready for pick up and have been for a while. Further added there is actually 3 different Rx's for pen needles on file so in terms of pen needles, she saw no issues or concerns other than for pt to stop by and pick up Rx. In addition, pharmacist states Levemir is $150.00+ without copay card. States pt had used a copay card in the past to reduce cost. If she changes Rx to 90 day, copay increases to well over $300 without copay card. Pharmacist is recommending pt print copay card to reduce cost.  Called pt and informed her about my conversation above. Verbalized acceptance and understanding. No further action to be taken at this time.

## 2019-08-11 ENCOUNTER — Other Ambulatory Visit: Payer: Self-pay

## 2019-08-11 ENCOUNTER — Telehealth: Payer: Self-pay | Admitting: Endocrinology

## 2019-08-11 DIAGNOSIS — E1143 Type 2 diabetes mellitus with diabetic autonomic (poly)neuropathy: Secondary | ICD-10-CM

## 2019-08-11 MED ORDER — LEVEMIR FLEXTOUCH 100 UNIT/ML ~~LOC~~ SOPN: 150.0000 [IU] | PEN_INJECTOR | Freq: Every day | SUBCUTANEOUS | 0 refills | Status: DC

## 2019-08-11 MED ORDER — LEVEMIR FLEXTOUCH 100 UNIT/ML ~~LOC~~ SOPN: 150.0000 [IU] | PEN_INJECTOR | Freq: Every day | SUBCUTANEOUS | 3 refills | Status: DC

## 2019-08-11 NOTE — Telephone Encounter (Signed)
Called pt to further clarify. States her CBG's have been in the high 200's for the last 2 days and always in the morning. Further added, her CBG was 234 when waking this morning then she decided to check before lunch and her CBG was 257. Still taking Levemir 135 at bedtime and Novolog 35, 33 and 38. Please advise.

## 2019-08-11 NOTE — Telephone Encounter (Signed)
Called pt and informed about new orders as documented by Dr. Everardo All below. Also sent in a 90 day Rx per pt request as indicated below:  Outpatient Medication Detail   Disp Refills Start End   insulin detemir (LEVEMIR FLEXTOUCH) 100 UNIT/ML FlexPen 135 mL 0 08/11/2019    Sig - Route: Inject 150 Units into the skin at bedtime. INCREASED DOSAGE HAS BEEN CONFIRMED AND IS ACCURATE - Subcutaneous   Sent to pharmacy as: insulin detemir (LEVEMIR FLEXTOUCH) 100 UNIT/ML FlexPen   E-Prescribing Status: Receipt confirmed by pharmacy (08/11/2019  4:59 PM EST)

## 2019-08-11 NOTE — Telephone Encounter (Signed)
Patient called stating that her blood sugar was over 200 and she was told to give our office a call if this happened. Patient would like to be called at ph# [phone]

## 2019-08-11 NOTE — Telephone Encounter (Signed)
Please increase Levemir to 150 units qhs

## 2019-08-11 NOTE — Telephone Encounter (Signed)
I am assuming you told her to call if she had CBG's over 200 for more than 1 day or more frequent than 1 episode?

## 2019-08-11 NOTE — Telephone Encounter (Signed)
OK, I just need to know what time of day the cbg is highest.

## 2019-09-11 ENCOUNTER — Encounter: Payer: Self-pay | Admitting: Endocrinology

## 2019-09-17 ENCOUNTER — Other Ambulatory Visit: Payer: Self-pay

## 2019-09-19 ENCOUNTER — Ambulatory Visit: Payer: No Typology Code available for payment source | Admitting: Endocrinology

## 2019-09-19 ENCOUNTER — Encounter: Payer: Self-pay | Admitting: Endocrinology

## 2019-09-19 ENCOUNTER — Other Ambulatory Visit: Payer: Self-pay

## 2019-09-19 VITALS — BP 124/72 | HR 90 | Ht 62.0 in | Wt 191.0 lb

## 2019-09-19 DIAGNOSIS — E1143 Type 2 diabetes mellitus with diabetic autonomic (poly)neuropathy: Secondary | ICD-10-CM | POA: Diagnosis not present

## 2019-09-19 DIAGNOSIS — Z794 Long term (current) use of insulin: Secondary | ICD-10-CM | POA: Diagnosis not present

## 2019-09-19 LAB — POCT GLYCOSYLATED HEMOGLOBIN (HGB A1C): Hemoglobin A1C: 8.1 % — AB (ref 4.0–5.6)

## 2019-09-19 MED ORDER — TRULICITY 0.75 MG/0.5ML ~~LOC~~ SOAJ: 0.7500 mg | SUBCUTANEOUS | 3 refills | Status: DC

## 2019-09-19 MED ORDER — LEVEMIR FLEXTOUCH 100 UNIT/ML ~~LOC~~ SOPN: 150.0000 [IU] | PEN_INJECTOR | Freq: Every day | SUBCUTANEOUS | 3 refills | Status: DC

## 2019-09-19 MED ORDER — NOVOLOG FLEXPEN 100 UNIT/ML ~~LOC~~ SOPN: 38.0000 [IU] | PEN_INJECTOR | Freq: Three times a day (TID) | SUBCUTANEOUS | 3 refills | Status: DC

## 2019-09-19 NOTE — Progress Notes (Signed)
Subjective:    Patient ID: Emma Payne, female    DOB: [birth year], 59 y.o.   MRN: [id number]  HPI Pt returns for f/u of diabetes mellitus: DM type: Insulin-requiring type 2. Dx'ed: 0086 Complications: PN and GP.   Therapy: insulin since early 2018 GDM: never (G0) DKA: never Severe hypoglycemia: never.  Pancreatitis: never Pancreatic imaging: never Other: she takes multiple daily injections; she declines weight loss surgery; she declines insulin pump and continuous glucose monitor; she did not tolerate [place] or Invokana (yeast infect).   Interval history: she brings a record of her cbg's which I have reviewed today.  cbg varies from 85-353.  cbg is still highest fasting.  pt states she feels well in general, except for anxiety.  She says she does not miss the insulin. Past Medical History:  Diagnosis Date  . Diabetes ([place])     No past surgical history on file.  Social History   Socioeconomic History  . Marital status: Divorced    Spouse name: Not on file  . Number of children: Not on file  . Years of education: Not on file  . Highest education level: Not on file  Occupational History  . Not on file  Tobacco Use  . Smoking status: Never Smoker  . Smokeless tobacco: Never Used  Substance and Sexual Activity  . Alcohol use: No  . Drug use: Not on file  . Sexual activity: Not on file  Other Topics Concern  . Not on file  Social History Narrative  . Not on file   Social Determinants of Health   Financial Resource Strain:   . Difficulty of Paying Living Expenses:   Food Insecurity:   . Worried About Charity fundraiser in the Last Year:   . Arboriculturist in the Last Year:   Transportation Needs:   . Film/video editor (Medical):   Marland Kitchen Lack of Transportation (Non-Medical):   Physical Activity:   . Days of Exercise per Week:   . Minutes of Exercise per Session:   Stress:   . Feeling of Stress :   Social Connections:   . Frequency of Communication with  Friends and Family:   . Frequency of Social Gatherings with Friends and Family:   . Attends Religious Services:   . Active Member of Clubs or Organizations:   . Attends Archivist Meetings:   Marland Kitchen Marital Status:   Intimate Partner Violence:   . Fear of Current or Ex-Partner:   . Emotionally Abused:   Marland Kitchen Physically Abused:   . Sexually Abused:     Current Outpatient Medications on File Prior to Visit  Medication Sig Dispense Refill  . acetaZOLAMIDE (DIAMOX) 250 MG tablet Take 250 mg by mouth 3 (three) times daily. Take 1 tablet at bedtime    . albuterol (PROVENTIL HFA;VENTOLIN HFA) 108 (90 Base) MCG/ACT inhaler Inhale 2 puffs into the lungs every 6 (six) hours as needed for wheezing or shortness of breath.    . Ascorbic Acid (VITAMIN C) 1000 MG tablet Take 1,000 mg by mouth.    Marland Kitchen aspirin 81 MG chewable tablet Chew 81 mg by mouth.    . BD INTEGRA SYRINGE 25G X 1" 3 ML MISC     . carbidopa-levodopa (SINEMET IR) 25-100 MG tablet Take by mouth 3 (three) times daily.     . Cholecalciferol (VITAMIN D3) 10000 units TABS Take by mouth.    . CRANBERRY PO Take 25,200 mg by mouth. 2 tabs  in the pm    . cyanocobalamin (,VITAMIN B-12,) 1000 MCG/ML injection Inject into the muscle.    . Cyanocobalamin (VITAMIN B 12 PO) Take 3,000 mcg by mouth.    . Dextromethorphan-Quinidine 20-10 MG CAPS 1 in the am 1 at lunch    . dicyclomine (BENTYL) 10 MG capsule TAKE ONE CAPSULE 4 TIMES A DAY AS NEEDED    . erythromycin (E-MYCIN) 250 MG tablet Take by mouth.    Di Kindle SULFATE PO Take 65 mg by mouth. 3 times daily    . Insulin Pen Needle (PEN NEEDLES) 31G X 5 MM MISC 1 Device by Other route See admin instructions. 1 needle 5 times per day 500 each 3  . irbesartan (AVAPRO) 150 MG tablet Take 150 mg by mouth daily.  0  . ketoconazole (NIZORAL) 2 % cream Apply topically.    Boris Lown Oil 500 MG CAPS Take by mouth. 1 at night    . lamoTRIgine (LAMICTAL) 200 MG tablet 2 (two) times daily.     Marland Kitchen  levETIRAcetam (KEPPRA) 500 MG tablet TAKE 2 TABLETS IN THE AM, HALF A TABLET AT NOON, HALF A TABLET AT 4PM, 2 TABLETS AT BEDTIME  5  . magnesium chloride (SLOW-MAG) 64 MG TBEC SR tablet Take by mouth. 3 in the am 2 in the evening    . meloxicam (MOBIC) 7.5 MG tablet Take 7.5 mg by mouth daily. 1 in the am    . montelukast (SINGULAIR) 10 MG tablet Take 10 mg by mouth daily.  1  . neomycin-polymyxin b-dexamethasone (MAXITROL) 3.5-10000-0.1 OINT     . nitrofurantoin (MACRODANTIN) 100 MG capsule Take 1 capsule (100 mg total) by mouth 3 (three) times daily. 15 capsule 0  . Polyethylene Glycol 3350 (PEG 3350) POWD Take 17 g by mouth.    . primidone (MYSOLINE) 50 MG tablet 2 (two) times daily.   0  . promethazine (PHENERGAN) 25 MG tablet Take by mouth.    . QUEtiapine (SEROQUEL) 25 MG tablet Take by mouth.    . rizatriptan (MAXALT-MLT) 10 MG disintegrating tablet     . tiZANidine (ZANAFLEX) 4 MG capsule Take 4 mg by mouth 2 (two) times daily.     No current facility-administered medications on file prior to visit.    Allergies  Allergen Reactions  . Aptiom [Eslicarbazepine]     Causes seizures  . Cedax [Ceftibuten]     Causes vomiting  . Prednisone     Seizures  . Vimpat [Lacosamide]     Causes seizures    Family History  Problem Relation Age of Onset  . Diabetes Father     BP 124/72   Pulse 90   Ht 5\' 2"  (1.575 m)   Wt 191 lb (86.6 kg)   SpO2 98%   BMI 34.93 kg/m    Review of Systems She denies hypoglycemia.      Objective:   Physical Exam VITAL SIGNS:  See vs page GENERAL: no distress Pulses: dorsalis pedis intact bilat.   MSK: no deformity of the feet CV: no leg edema Skin:  no ulcer on the feet, but there are heavy calluses.  normal color and temp on the feet. Neuro: sensation is intact to touch on the feet, but decreased from normal.   Lab Results  Component Value Date   HGBA1C 8.1 (A) 09/19/2019       Assessment & Plan:  Insulin-requiring type 2 DM, with  GP: she needs increased rx Overweight: Trulicity might help.  Patient Instructions  Please continue the same insulins, and: I have sent a prescription to your pharmacy, to start Trulicity check your blood sugar twice a day.  vary the time of day when you check, between before the 3 meals, and at bedtime.  also check if you have symptoms of your blood sugar being too high or too low.  please keep a record of the readings and bring it to your next appointment here (or you can bring the meter itself).  You can write it on any piece of paper.  please call us sooner if your blood sugar goes below 70, or if you have a lot of readings over 200.   Please come back for a follow-up appointment in 2 months.

## 2019-09-19 NOTE — Patient Instructions (Addendum)
Please continue the same insulins, and: I have sent a prescription to your pharmacy, to start Trulicity check your blood sugar twice a day.  vary the time of day when you check, between before the 3 meals, and at bedtime.  also check if you have symptoms of your blood sugar being too high or too low.  please keep a record of the readings and bring it to your next appointment here (or you can bring the meter itself).  You can write it on any piece of paper.  please call us sooner if your blood sugar goes below 70, or if you have a lot of readings over 200.   Please come back for a follow-up appointment in 2 months.

## 2019-11-24 ENCOUNTER — Ambulatory Visit: Payer: No Typology Code available for payment source | Admitting: Endocrinology

## 2019-12-17 ENCOUNTER — Ambulatory Visit: Payer: No Typology Code available for payment source | Admitting: Endocrinology

## 2020-01-21 ENCOUNTER — Ambulatory Visit: Payer: No Typology Code available for payment source | Admitting: Endocrinology

## 2020-02-17 ENCOUNTER — Encounter: Payer: Self-pay | Admitting: Endocrinology

## 2020-03-25 ENCOUNTER — Telehealth: Payer: Self-pay | Admitting: Endocrinology

## 2020-03-25 NOTE — Telephone Encounter (Signed)
Last chance.  Next cancellation=d/c from practice

## 2020-03-25 NOTE — Telephone Encounter (Signed)
Due to previous cancelled appointments, is this patient ok to reschedule? She called wanting to reschedule her last appointment. Please advise.

## 2020-04-15 LAB — HM DIABETES EYE EXAM

## 2020-04-19 ENCOUNTER — Other Ambulatory Visit: Payer: Self-pay

## 2020-04-19 ENCOUNTER — Encounter: Payer: Self-pay | Admitting: Endocrinology

## 2020-04-19 ENCOUNTER — Ambulatory Visit (INDEPENDENT_AMBULATORY_CARE_PROVIDER_SITE_OTHER): Payer: No Typology Code available for payment source | Admitting: Endocrinology

## 2020-04-19 VITALS — BP 144/86 | HR 80 | Ht 62.0 in | Wt 179.0 lb

## 2020-04-19 DIAGNOSIS — Z794 Long term (current) use of insulin: Secondary | ICD-10-CM

## 2020-04-19 DIAGNOSIS — E1143 Type 2 diabetes mellitus with diabetic autonomic (poly)neuropathy: Secondary | ICD-10-CM | POA: Diagnosis not present

## 2020-04-19 LAB — POCT GLYCOSYLATED HEMOGLOBIN (HGB A1C): Hemoglobin A1C: 7.9 % — AB (ref 4.0–5.6)

## 2020-04-19 MED ORDER — NOVOLOG FLEXPEN 100 UNIT/ML ~~LOC~~ SOPN: 30.0000 [IU] | PEN_INJECTOR | Freq: Three times a day (TID) | SUBCUTANEOUS | 3 refills | Status: DC

## 2020-04-19 MED ORDER — TRULICITY 1.5 MG/0.5ML ~~LOC~~ SOAJ: 1.5000 mg | SUBCUTANEOUS | 3 refills | Status: DC

## 2020-04-19 MED ORDER — LEVEMIR FLEXTOUCH 100 UNIT/ML ~~LOC~~ SOPN: 140.0000 [IU] | PEN_INJECTOR | Freq: Every day | SUBCUTANEOUS | 3 refills | Status: DC

## 2020-04-19 NOTE — Patient Instructions (Addendum)
Please reduce both insulins, as below, and: I have sent a prescription to your pharmacy, to increase the Trulicity check your blood sugar twice a day.  vary the time of day when you check, between before the 3 meals, and at bedtime.  also check if you have symptoms of your blood sugar being too high or too low.  please keep a record of the readings and bring it to your next appointment here (or you can bring the meter itself).  You can write it on any piece of paper.  please call us sooner if your blood sugar goes below 70, or if you have a lot of readings over 200.   Please come back for a follow-up appointment in 2 months.

## 2020-04-19 NOTE — Progress Notes (Signed)
Subjective:    Patient ID: Emma Payne, female    DOB: 12-23-1960, 59 y.o.   MRN: [id number]  HPI Pt returns for f/u of diabetes mellitus: DM type: Insulin-requiring type 2. Dx'ed: 2002 Complications: PN and GP.   Therapy: insulin since early 2018, and Trulicity.   GDM: never (G0) DKA: never Severe hypoglycemia: never.  Pancreatitis: never Pancreatic imaging: never Other: she takes multiple daily injections; she declines weight loss surgery; she declines insulin pump and continuous glucose monitor; she did not tolerate [place] or Invokana (yeast infect).   Interval history: she says cbg varies from 73-300.  It is in general highest fasting. anxiety is well-controlled, on Klonopin.  She says she does not miss the insulin.  She seldom has hypoglycemia, and these episodes are mild.   Past Medical History:  Diagnosis Date  . Diabetes ([facility])     No past surgical history on file.  Social History   Socioeconomic History  . Marital status: Divorced    Spouse name: Not on file  . Number of children: Not on file  . Years of education: Not on file  . Highest education level: Not on file  Occupational History  . Not on file  Tobacco Use  . Smoking status: Never Smoker  . Smokeless tobacco: Never Used  Substance and Sexual Activity  . Alcohol use: No  . Drug use: Not on file  . Sexual activity: Not on file  Other Topics Concern  . Not on file  Social History Narrative  . Not on file   Social Determinants of Health   Financial Resource Strain:   . Difficulty of Paying Living Expenses: Not on file  Food Insecurity:   . Worried About Programme researcher, broadcasting/film/video in the Last Year: Not on file  . Ran Out of Food in the Last Year: Not on file  Transportation Needs:   . Lack of Transportation (Medical): Not on file  . Lack of Transportation (Non-Medical): Not on file  Physical Activity:   . Days of Exercise per Week: Not on file  . Minutes of Exercise per Session: Not on file   Stress:   . Feeling of Stress : Not on file  Social Connections:   . Frequency of Communication with Friends and Family: Not on file  . Frequency of Social Gatherings with Friends and Family: Not on file  . Attends Religious Services: Not on file  . Active Member of Clubs or Organizations: Not on file  . Attends Banker Meetings: Not on file  . Marital Status: Not on file  Intimate Partner Violence:   . Fear of Current or Ex-Partner: Not on file  . Emotionally Abused: Not on file  . Physically Abused: Not on file  . Sexually Abused: Not on file    Current Outpatient Medications on File Prior to Visit  Medication Sig Dispense Refill  . acetaZOLAMIDE (DIAMOX) 250 MG tablet Take 250 mg by mouth 3 (three) times daily. Take 1 tablet at bedtime    . albuterol (PROVENTIL HFA;VENTOLIN HFA) 108 (90 Base) MCG/ACT inhaler Inhale 2 puffs into the lungs every 6 (six) hours as needed for wheezing or shortness of breath.    . Ascorbic Acid (VITAMIN C) 1000 MG tablet Take 1,000 mg by mouth.    Marland Kitchen aspirin 81 MG chewable tablet Chew 81 mg by mouth.    . BD INTEGRA SYRINGE 25G X 1" 3 ML MISC     . carbidopa-levodopa (SINEMET IR) 25-100  MG tablet Take by mouth 3 (three) times daily.     . Cholecalciferol (VITAMIN D3) 10000 units TABS Take by mouth.    . CRANBERRY PO Take 25,200 mg by mouth. 2 tabs in the pm    . cyanocobalamin (,VITAMIN B-12,) 1000 MCG/ML injection Inject into the muscle.    . Cyanocobalamin (VITAMIN B 12 PO) Take 3,000 mcg by mouth.    . Dextromethorphan-Quinidine 20-10 MG CAPS 1 in the am 1 at lunch    . dicyclomine (BENTYL) 10 MG capsule TAKE ONE CAPSULE 4 TIMES A DAY AS NEEDED    . erythromycin (E-MYCIN) 250 MG tablet Take by mouth.    Di Kindle SULFATE PO Take 65 mg by mouth. 3 times daily    . Insulin Pen Needle (PEN NEEDLES) 31G X 5 MM MISC 1 Device by Other route See admin instructions. 1 needle 5 times per day 500 each 3  . irbesartan (AVAPRO) 150 MG tablet Take  150 mg by mouth daily.  0  . ketoconazole (NIZORAL) 2 % cream Apply topically.    Boris Lown Oil 500 MG CAPS Take by mouth. 1 at night    . lamoTRIgine (LAMICTAL) 200 MG tablet 2 (two) times daily.     Marland Kitchen levETIRAcetam (KEPPRA) 500 MG tablet TAKE 2 TABLETS IN THE AM, HALF A TABLET AT NOON, HALF A TABLET AT 4PM, 2 TABLETS AT BEDTIME  5  . magnesium chloride (SLOW-MAG) 64 MG TBEC SR tablet Take by mouth. 3 in the am 2 in the evening    . meloxicam (MOBIC) 7.5 MG tablet Take 7.5 mg by mouth daily. 1 in the am    . montelukast (SINGULAIR) 10 MG tablet Take 10 mg by mouth daily.  1  . neomycin-polymyxin b-dexamethasone (MAXITROL) 3.5-10000-0.1 OINT     . nitrofurantoin (MACRODANTIN) 100 MG capsule Take 1 capsule (100 mg total) by mouth 3 (three) times daily. 15 capsule 0  . Polyethylene Glycol 3350 (PEG 3350) POWD Take 17 g by mouth.    . primidone (MYSOLINE) 50 MG tablet 2 (two) times daily.   0  . promethazine (PHENERGAN) 25 MG tablet Take by mouth.    . QUEtiapine (SEROQUEL) 25 MG tablet Take by mouth.    . rizatriptan (MAXALT-MLT) 10 MG disintegrating tablet     . tiZANidine (ZANAFLEX) 4 MG capsule Take 4 mg by mouth 2 (two) times daily.     No current facility-administered medications on file prior to visit.    Allergies  Allergen Reactions  . Aptiom [Eslicarbazepine]     Causes seizures  . Cedax [Ceftibuten]     Causes vomiting  . Prednisone     Seizures  . Vimpat [Lacosamide]     Causes seizures    Family History  Problem Relation Age of Onset  . Diabetes Father     BP (!) 144/86   Pulse 80   Ht 5\' 2"  (1.575 m)   Wt 179 lb (81.2 kg)   SpO2 98%   BMI 32.74 kg/m    Review of Systems Denies nausea    Objective:   Physical Exam VITAL SIGNS:  See vs page GENERAL: no distress Pulses: dorsalis pedis intact bilat.   MSK: no deformity of the feet CV: no leg edema Skin:  no ulcer on the feet, but there are heavy calluses.  normal color and temp on the feet.   Neuro:  sensation is intact to touch on the feet.    Lab Results  Component  Value Date   HGBA1C 7.9 (A) 04/19/2020    Lab Results  Component Value Date   CREATININE 0.77 01/17/2019   BUN 17 01/17/2019   NA 139 01/17/2019   K 4.1 01/17/2019   CL 107 01/17/2019   CO2 23 01/17/2019       Assessment & Plan:  Insulin-requiring type 2 DM, with PN: uncontrolled GP: we'll have Trulicity cautiously  Patient Instructions  Please reduce both insulins, as below, and: I have sent a prescription to your pharmacy, to increase the Trulicity check your blood sugar twice a day.  vary the time of day when you check, between before the 3 meals, and at bedtime.  also check if you have symptoms of your blood sugar being too high or too low.  please keep a record of the readings and bring it to your next appointment here (or you can bring the meter itself).  You can write it on any piece of paper.  please call us sooner if your blood sugar goes below 70, or if you have a lot of readings over 200.   Please come back for a follow-up appointment in 2 months.

## 2020-04-26 ENCOUNTER — Other Ambulatory Visit: Payer: Self-pay | Admitting: Endocrinology

## 2020-06-14 ENCOUNTER — Encounter: Payer: Self-pay | Admitting: Endocrinology

## 2020-06-14 ENCOUNTER — Other Ambulatory Visit: Payer: Self-pay

## 2020-06-14 ENCOUNTER — Ambulatory Visit: Payer: No Typology Code available for payment source | Admitting: Endocrinology

## 2020-06-14 VITALS — BP 166/94 | HR 92 | Ht 62.0 in | Wt 178.0 lb

## 2020-06-14 DIAGNOSIS — E1143 Type 2 diabetes mellitus with diabetic autonomic (poly)neuropathy: Secondary | ICD-10-CM | POA: Diagnosis not present

## 2020-06-14 DIAGNOSIS — Z794 Long term (current) use of insulin: Secondary | ICD-10-CM | POA: Diagnosis not present

## 2020-06-14 LAB — POCT GLYCOSYLATED HEMOGLOBIN (HGB A1C): Hemoglobin A1C: 10 % — AB (ref 4.0–5.6)

## 2020-06-14 MED ORDER — TRULICITY 3 MG/0.5ML ~~LOC~~ SOAJ: 3.0000 mg | SUBCUTANEOUS | 3 refills | Status: DC

## 2020-06-14 NOTE — Progress Notes (Signed)
Subjective:    Patient ID: Emma Payne, female    DOB: 07-29-1960, 60 y.o.   MRN: [id number]  HPI Pt returns for f/u of diabetes mellitus:  DM type: Insulin-requiring type 2. Dx'ed: 2002 Complications: PN and GP.   Therapy: insulin since early 2018, and Trulicity.   GDM: never (G0) DKA: never Severe hypoglycemia: never.  Pancreatitis: never Pancreatic imaging: never Other: she takes multiple daily injections; she declines weight loss surgery; she declines insulin pump and continuous glucose monitor; she did not tolerate [place] or Invokana (yeast infect).   Interval history: she says cbg varies from 73-425.  It is still in general highest fasting, but she does not check at HS.  She says she does not miss the insulin.  She has mild hypoglycemia approx once per month.   Past Medical History:  Diagnosis Date  . Diabetes (HCC)     No past surgical history on file.  Social History   Socioeconomic History  . Marital status: Divorced    Spouse name: Not on file  . Number of children: Not on file  . Years of education: Not on file  . Highest education level: Not on file  Occupational History  . Not on file  Tobacco Use  . Smoking status: Never Smoker  . Smokeless tobacco: Never Used  Substance and Sexual Activity  . Alcohol use: No  . Drug use: Not on file  . Sexual activity: Not on file  Other Topics Concern  . Not on file  Social History Narrative  . Not on file   Social Determinants of Health   Financial Resource Strain: Not on file  Food Insecurity: Not on file  Transportation Needs: Not on file  Physical Activity: Not on file  Stress: Not on file  Social Connections: Not on file  Intimate Partner Violence: Not on file    Current Outpatient Medications on File Prior to Visit  Medication Sig Dispense Refill  . acetaZOLAMIDE (DIAMOX) 250 MG tablet Take 250 mg by mouth 3 (three) times daily. Take 1 tablet at bedtime    . albuterol (PROVENTIL HFA;VENTOLIN HFA)  108 (90 Base) MCG/ACT inhaler Inhale 2 puffs into the lungs every 6 (six) hours as needed for wheezing or shortness of breath.    . Ascorbic Acid (VITAMIN C) 1000 MG tablet Take 1,000 mg by mouth.    Marland Kitchen aspirin 81 MG chewable tablet Chew 81 mg by mouth.    . BD INTEGRA SYRINGE 25G X 1" 3 ML MISC     . BD PEN NEEDLE NANO 2ND GEN 32G X 4 MM MISC USE 4 TIMES A DAY 400 each 3  . carbidopa-levodopa (SINEMET IR) 25-100 MG tablet Take by mouth 3 (three) times daily.     . Cholecalciferol (VITAMIN D3) 10000 units TABS Take by mouth.    . CRANBERRY PO Take 25,200 mg by mouth. 2 tabs in the pm    . cyanocobalamin (,VITAMIN B-12,) 1000 MCG/ML injection Inject into the muscle.    . Cyanocobalamin (VITAMIN B 12 PO) Take 3,000 mcg by mouth.    . Dextromethorphan-Quinidine 20-10 MG CAPS 1 in the am 1 at lunch    . dicyclomine (BENTYL) 10 MG capsule TAKE ONE CAPSULE 4 TIMES A DAY AS NEEDED    . erythromycin (E-MYCIN) 250 MG tablet Take by mouth.    Di Kindle SULFATE PO Take 65 mg by mouth. 3 times daily    . insulin detemir (LEVEMIR FLEXTOUCH) 100 UNIT/ML FlexPen Inject  140 Units into the skin at bedtime. 150 mL 3  . Insulin Pen Needle (PEN NEEDLES) 31G X 5 MM MISC 1 Device by Other route See admin instructions. 1 needle 5 times per day 500 each 3  . irbesartan (AVAPRO) 150 MG tablet Take 150 mg by mouth daily.  0  . ketoconazole (NIZORAL) 2 % cream Apply topically.    Boris Lown Oil 500 MG CAPS Take by mouth. 1 at night    . lamoTRIgine (LAMICTAL) 200 MG tablet 2 (two) times daily.     Marland Kitchen levETIRAcetam (KEPPRA) 500 MG tablet TAKE 2 TABLETS IN THE AM, HALF A TABLET AT NOON, HALF A TABLET AT 4PM, 2 TABLETS AT BEDTIME  5  . magnesium chloride (SLOW-MAG) 64 MG TBEC SR tablet Take by mouth. 3 in the am 2 in the evening    . meloxicam (MOBIC) 7.5 MG tablet Take 7.5 mg by mouth daily. 1 in the am    . montelukast (SINGULAIR) 10 MG tablet Take 10 mg by mouth daily.  1  . neomycin-polymyxin b-dexamethasone (MAXITROL)  3.5-10000-0.1 OINT     . nitrofurantoin (MACRODANTIN) 100 MG capsule Take 1 capsule (100 mg total) by mouth 3 (three) times daily. 15 capsule 0  . NOVOLOG FLEXPEN 100 UNIT/ML FlexPen Inject 30 Units into the skin 3 (three) times daily with meals. and pen needles 4/day 90 mL 3  . Polyethylene Glycol 3350 (PEG 3350) POWD Take 17 g by mouth.    . primidone (MYSOLINE) 50 MG tablet 2 (two) times daily.   0  . promethazine (PHENERGAN) 25 MG tablet Take by mouth.    . QUEtiapine (SEROQUEL) 25 MG tablet Take by mouth.    . rizatriptan (MAXALT-MLT) 10 MG disintegrating tablet     . tiZANidine (ZANAFLEX) 4 MG capsule Take 4 mg by mouth 2 (two) times daily.     No current facility-administered medications on file prior to visit.    Allergies  Allergen Reactions  . Aptiom [Eslicarbazepine]     Causes seizures  . Cedax [Ceftibuten]     Causes vomiting  . Prednisone     Seizures  . Vimpat [Lacosamide]     Causes seizures    Family History  Problem Relation Age of Onset  . Diabetes Father     BP (!) 166/94   Pulse 92   Ht 5\' 2"  (1.575 m)   Wt 178 lb (80.7 kg)   SpO2 99%   BMI 32.56 kg/m    Review of Systems Denies LOC.  She seldom has nausea, and this is mild.      Objective:   Physical Exam VITAL SIGNS:  See vs page GENERAL: no distress Pulses: dorsalis pedis intact bilat.   MSK: no deformity of the feet CV: no leg edema Skin:  no ulcer on the feet, but there are heavy calluses.  normal color and temp on the feet.   Neuro: sensation is intact to touch on the feet.   Ext: there is bilateral onychomycosis of the toenails.    Lab Results  Component Value Date   HGBA1C 10.0 (A) 06/14/2020       Assessment & Plan:  Insulin-requiring type 2 DM, with PN: uncontrolled.   Nausea, due to Trulicity.  We discussed.  She wants to try increasing.    Patient Instructions  I have sent a prescription to your pharmacy, to increase the Trulicity, and: Please continue the same  insulins.   check your blood sugar twice a  day.  vary the time of day when you check, between before the 3 meals, and at bedtime.  also check if you have symptoms of your blood sugar being too high or too low.  please keep a record of the readings and bring it to your next appointment here (or you can bring the meter itself).  You can write it on any piece of paper.  please call us sooner if your blood sugar goes below 70, or if you have a lot of readings over 200.   Please come back for a follow-up appointment in 2 months.

## 2020-06-14 NOTE — Patient Instructions (Addendum)
I have sent a prescription to your pharmacy, to increase the Trulicity, and: Please continue the same insulins.   check your blood sugar twice a day.  vary the time of day when you check, between before the 3 meals, and at bedtime.  also check if you have symptoms of your blood sugar being too high or too low.  please keep a record of the readings and bring it to your next appointment here (or you can bring the meter itself).  You can write it on any piece of paper.  please call us sooner if your blood sugar goes below 70, or if you have a lot of readings over 200.   Please come back for a follow-up appointment in 2 months.

## 2020-08-17 ENCOUNTER — Ambulatory Visit: Payer: No Typology Code available for payment source | Admitting: Endocrinology

## 2020-08-17 ENCOUNTER — Other Ambulatory Visit: Payer: Self-pay

## 2020-08-17 VITALS — BP 156/90 | HR 81 | Ht 62.0 in | Wt 171.4 lb

## 2020-08-17 DIAGNOSIS — Z794 Long term (current) use of insulin: Secondary | ICD-10-CM | POA: Diagnosis not present

## 2020-08-17 DIAGNOSIS — E1143 Type 2 diabetes mellitus with diabetic autonomic (poly)neuropathy: Secondary | ICD-10-CM | POA: Diagnosis not present

## 2020-08-17 LAB — POCT GLYCOSYLATED HEMOGLOBIN (HGB A1C): Hemoglobin A1C: 11.5 % — AB (ref 4.0–5.6)

## 2020-08-17 MED ORDER — TRULICITY 3 MG/0.5ML ~~LOC~~ SOAJ: 4.5000 mg | SUBCUTANEOUS | 3 refills | Status: DC

## 2020-08-17 NOTE — Patient Instructions (Addendum)
Your blood pressure is high today.  Please see your primary care provider soon, to have it rechecked I have sent a prescription to your pharmacy, to increase the Trulicity, and: Please continue the same insulins.  It is a lot of trouble to take 4 injections per day.  At this A1c, you are not receiving the benefit of this.  Please consider stopping the Novolog, and increasing the Levemir to 200 units each morning.  Please call if you decide to make this change.   check your blood sugar twice a day.  vary the time of day when you check, between before the 3 meals, and at bedtime.  also check if you have symptoms of your blood sugar being too high or too low.  please keep a record of the readings and bring it to your next appointment here (or you can bring the meter itself).  You can write it on any piece of paper.  please call us sooner if your blood sugar goes below 70, or if you have a lot of readings over 200.   Please come back for a follow-up appointment in 2 months.

## 2020-08-17 NOTE — Progress Notes (Signed)
Subjective:    Patient ID: Emma Payne, female    DOB: July 22, 1960, 60 y.o.   MRN: [id number]  HPI Pt returns for f/u of diabetes mellitus:  DM type: Insulin-requiring type 2. Dx'ed: 2002 Complications: PN and GP.   Therapy: insulin since early 2018, and Trulicity.   GDM: never (G0) DKA: never Severe hypoglycemia: never.  Pancreatitis: never Pancreatic imaging: never Other: she takes multiple daily injections; she declines weight loss surgery; she declines insulin pump and continuous glucose monitor; she did not tolerate [place] or Invokana (yeast infect).   Interval history: she says cbg varies from 78-200.  It is still in general highest fasting.  She says she does not miss the insulin.  She has mild hypoglycemia approx once per month.   Past Medical History:  Diagnosis Date  . Diabetes (HCC)     No past surgical history on file.  Social History   Socioeconomic History  . Marital status: Divorced    Spouse name: Not on file  . Number of children: Not on file  . Years of education: Not on file  . Highest education level: Not on file  Occupational History  . Not on file  Tobacco Use  . Smoking status: Never Smoker  . Smokeless tobacco: Never Used  Substance and Sexual Activity  . Alcohol use: No  . Drug use: Not on file  . Sexual activity: Not on file  Other Topics Concern  . Not on file  Social History Narrative  . Not on file   Social Determinants of Health   Financial Resource Strain: Not on file  Food Insecurity: Not on file  Transportation Needs: Not on file  Physical Activity: Not on file  Stress: Not on file  Social Connections: Not on file  Intimate Partner Violence: Not on file    Current Outpatient Medications on File Prior to Visit  Medication Sig Dispense Refill  . acetaZOLAMIDE (DIAMOX) 250 MG tablet Take 250 mg by mouth 3 (three) times daily. Take 1 tablet at bedtime    . albuterol (PROVENTIL HFA;VENTOLIN HFA) 108 (90 Base) MCG/ACT inhaler  Inhale 2 puffs into the lungs every 6 (six) hours as needed for wheezing or shortness of breath.    . Ascorbic Acid (VITAMIN C) 1000 MG tablet Take 1,000 mg by mouth.    Marland Kitchen aspirin 81 MG chewable tablet Chew 81 mg by mouth.    . BD INTEGRA SYRINGE 25G X 1" 3 ML MISC     . BD PEN NEEDLE NANO 2ND GEN 32G X 4 MM MISC USE 4 TIMES A DAY 400 each 3  . carbidopa-levodopa (SINEMET IR) 25-100 MG tablet Take by mouth 3 (three) times daily.     . Cholecalciferol (VITAMIN D3) 10000 units TABS Take by mouth.    . CRANBERRY PO Take 25,200 mg by mouth. 2 tabs in the pm    . cyanocobalamin (,VITAMIN B-12,) 1000 MCG/ML injection Inject into the muscle.    . Cyanocobalamin (VITAMIN B 12 PO) Take 3,000 mcg by mouth.    . Dextromethorphan-Quinidine 20-10 MG CAPS 1 in the am 1 at lunch    . dicyclomine (BENTYL) 10 MG capsule TAKE ONE CAPSULE 4 TIMES A DAY AS NEEDED    . erythromycin (E-MYCIN) 250 MG tablet Take by mouth.    Di Kindle SULFATE PO Take 65 mg by mouth. 3 times daily    . insulin detemir (LEVEMIR FLEXTOUCH) 100 UNIT/ML FlexPen Inject 140 Units into the skin at bedtime.  150 mL 3  . Insulin Pen Needle (PEN NEEDLES) 31G X 5 MM MISC 1 Device by Other route See admin instructions. 1 needle 5 times per day 500 each 3  . irbesartan (AVAPRO) 150 MG tablet Take 150 mg by mouth daily.  0  . ketoconazole (NIZORAL) 2 % cream Apply topically.    Boris Lown Oil 500 MG CAPS Take by mouth. 1 at night    . lamoTRIgine (LAMICTAL) 200 MG tablet 2 (two) times daily.     . magnesium chloride (SLOW-MAG) 64 MG TBEC SR tablet Take by mouth. 3 in the am 2 in the evening    . meloxicam (MOBIC) 7.5 MG tablet Take 7.5 mg by mouth daily. 1 in the am    . montelukast (SINGULAIR) 10 MG tablet Take 10 mg by mouth daily.  1  . neomycin-polymyxin b-dexamethasone (MAXITROL) 3.5-10000-0.1 OINT     . nitrofurantoin (MACRODANTIN) 100 MG capsule Take 1 capsule (100 mg total) by mouth 3 (three) times daily. 15 capsule 0  . NOVOLOG FLEXPEN 100  UNIT/ML FlexPen Inject 30 Units into the skin 3 (three) times daily with meals. and pen needles 4/day 90 mL 3  . Polyethylene Glycol 3350 (PEG 3350) POWD Take 17 g by mouth.    . primidone (MYSOLINE) 50 MG tablet 2 (two) times daily.   0  . promethazine (PHENERGAN) 25 MG tablet Take by mouth.    . QUEtiapine (SEROQUEL) 25 MG tablet Take by mouth.    . rizatriptan (MAXALT-MLT) 10 MG disintegrating tablet     . tiZANidine (ZANAFLEX) 4 MG capsule Take 4 mg by mouth 2 (two) times daily.    Marland Kitchen levETIRAcetam (KEPPRA) 500 MG tablet TAKE 2 TABLETS IN THE AM, HALF A TABLET AT NOON, HALF A TABLET AT 4PM, 2 TABLETS AT BEDTIME (Patient not taking: Reported on 08/17/2020)  5   No current facility-administered medications on file prior to visit.    Allergies  Allergen Reactions  . Aptiom [Eslicarbazepine]     Causes seizures  . Cedax [Ceftibuten]     Causes vomiting  . Prednisone     Seizures  . Vimpat [Lacosamide]     Causes seizures    Family History  Problem Relation Age of Onset  . Diabetes Father     BP (!) 156/90 (BP Location: Right Arm, Patient Position: Sitting, Cuff Size: Normal)   Pulse 81   Ht 5\' 2"  (1.575 m)   Wt 171 lb 6.4 oz (77.7 kg)   SpO2 98%   BMI 31.35 kg/m    Review of Systems     Objective:   Physical Exam VITAL SIGNS:  See vs page GENERAL: no distress Pulses: dorsalis pedis intact bilat.   MSK: no deformity of the feet CV: no leg edema Skin:  no ulcer on the feet, but there are heavy calluses, and the skin is dry.  normal color and temp on the feet.   Neuro: sensation is intact to touch on the feet.   Ext: there is bilateral onychomycosis of the toenails.   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  Please see your primary care provider soon, to have it rechecked I have sent a prescription to your pharmacy, to increase the  Trulicity, and: Please continue the same insulins.  It is a lot of trouble to take 4 injections per  day.  At this A1c, you are not receiving the benefit of this.  Please consider stopping the Novolog, and increasing the Levemir to 200 units each morning.  Please call if you decide to make this change.   check your blood sugar twice a day.  vary the time of day when you check, between before the 3 meals, and at bedtime.  also check if you have symptoms of your blood sugar being too high or too low.  please keep a record of the readings and bring it to your next appointment here (or you can bring the meter itself).  You can write it on any piece of paper.  please call us sooner if your blood sugar goes below 70, or if you have a lot of readings over 200.   Please come back for a follow-up appointment in 2 months.

## 2020-09-28 ENCOUNTER — Other Ambulatory Visit: Payer: Self-pay | Admitting: Endocrinology

## 2020-09-28 DIAGNOSIS — E1143 Type 2 diabetes mellitus with diabetic autonomic (poly)neuropathy: Secondary | ICD-10-CM

## 2020-10-18 ENCOUNTER — Other Ambulatory Visit: Payer: Self-pay

## 2020-10-18 ENCOUNTER — Ambulatory Visit: Payer: No Typology Code available for payment source | Admitting: Endocrinology

## 2020-10-18 VITALS — BP 110/80 | HR 83 | Ht 62.0 in | Wt 171.4 lb

## 2020-10-18 DIAGNOSIS — E1143 Type 2 diabetes mellitus with diabetic autonomic (poly)neuropathy: Secondary | ICD-10-CM | POA: Diagnosis not present

## 2020-10-18 DIAGNOSIS — Z794 Long term (current) use of insulin: Secondary | ICD-10-CM | POA: Diagnosis not present

## 2020-10-18 LAB — POCT GLYCOSYLATED HEMOGLOBIN (HGB A1C): Hemoglobin A1C: 8.7 % — AB (ref 4.0–5.6)

## 2020-10-18 MED ORDER — LEVEMIR FLEXTOUCH 100 UNIT/ML ~~LOC~~ SOPN: 200.0000 [IU] | PEN_INJECTOR | SUBCUTANEOUS | 3 refills | Status: DC

## 2020-10-18 MED ORDER — TRULICITY 4.5 MG/0.5ML ~~LOC~~ SOAJ: 4.5000 mg | SUBCUTANEOUS | 3 refills | Status: DC

## 2020-10-18 NOTE — Progress Notes (Signed)
Subjective:    Patient ID: Emma Payne, female    DOB: Oct 19, 1960, 60 y.o.   MRN: [id number]  HPI Pt returns for f/u of diabetes mellitus:  DM type: Insulin-requiring type 2. Dx'ed: 2002 Complications: PN and GP.   Therapy: insulin since early 2018, and Trulicity.   GDM: never (G0) DKA: never Severe hypoglycemia: never.  Pancreatitis: never Pancreatic imaging: never Other: she takes multiple daily injections; she declines weight loss surgery; she declines insulin pump and continuous glucose monitor; she did not tolerate [place] or Invokana (yeast infect).   Interval history: she says cbg varies from 73-220.  It is still in general highest fasting, and lowest in the afternoon.  She seldom misses the insulin.  She has mild hypoglycemia approx once per month.  Pt says she has considered what we discussed last time, and she wants to change to QD insulin.   Past Medical History:  Diagnosis Date  . Diabetes (HCC)     No past surgical history on file.  Social History   Socioeconomic History  . Marital status: Divorced    Spouse name: Not on file  . Number of children: Not on file  . Years of education: Not on file  . Highest education level: Not on file  Occupational History  . Not on file  Tobacco Use  . Smoking status: Never Smoker  . Smokeless tobacco: Never Used  Substance and Sexual Activity  . Alcohol use: No  . Drug use: Not on file  . Sexual activity: Not on file  Other Topics Concern  . Not on file  Social History Narrative  . Not on file   Social Determinants of Health   Financial Resource Strain: Not on file  Food Insecurity: Not on file  Transportation Needs: Not on file  Physical Activity: Not on file  Stress: Not on file  Social Connections: Not on file  Intimate Partner Violence: Not on file    Current Outpatient Medications on File Prior to Visit  Medication Sig Dispense Refill  . acetaZOLAMIDE (DIAMOX) 250 MG tablet Take 250 mg by mouth 3  (three) times daily. Take 1 tablet at bedtime    . albuterol (PROVENTIL HFA;VENTOLIN HFA) 108 (90 Base) MCG/ACT inhaler Inhale 2 puffs into the lungs every 6 (six) hours as needed for wheezing or shortness of breath.    . Ascorbic Acid (VITAMIN C) 1000 MG tablet Take 1,000 mg by mouth.    Marland Kitchen aspirin 81 MG chewable tablet Chew 81 mg by mouth.    . Atogepant (QULIPTA) 60 MG TABS Take by mouth daily.    . BD INTEGRA SYRINGE 25G X 1" 3 ML MISC     . BD PEN NEEDLE NANO 2ND GEN 32G X 4 MM MISC USE 4 TIMES A DAY 400 each 3  . carbidopa-levodopa (SINEMET IR) 25-100 MG tablet Take by mouth 3 (three) times daily.     . Cholecalciferol (VITAMIN D3) 10000 units TABS Take by mouth.    . CRANBERRY PO Take 25,200 mg by mouth. 2 tabs in the pm    . cyanocobalamin (,VITAMIN B-12,) 1000 MCG/ML injection Inject into the muscle.    . Cyanocobalamin (VITAMIN B 12 PO) Take 3,000 mcg by mouth.    . Dextromethorphan-Quinidine 20-10 MG CAPS 1 in the am 1 at lunch    . dicyclomine (BENTYL) 10 MG capsule TAKE ONE CAPSULE 4 TIMES A DAY AS NEEDED    . erythromycin (E-MYCIN) 250 MG tablet Take by mouth.    Marland Kitchen  FERROUS SULFATE PO Take 65 mg by mouth. 3 times daily    . Insulin Pen Needle (PEN NEEDLES) 31G X 5 MM MISC 1 Device by Other route See admin instructions. 1 needle 5 times per day 500 each 3  . irbesartan (AVAPRO) 150 MG tablet Take 150 mg by mouth daily.  0  . ketoconazole (NIZORAL) 2 % cream Apply topically.    Boris Lown Oil 500 MG CAPS Take by mouth. 1 at night    . lamoTRIgine (LAMICTAL) 200 MG tablet 2 (two) times daily.     . magnesium chloride (SLOW-MAG) 64 MG TBEC SR tablet Take by mouth. 3 in the am 2 in the evening    . meloxicam (MOBIC) 7.5 MG tablet Take 7.5 mg by mouth daily. 1 in the am    . montelukast (SINGULAIR) 10 MG tablet Take 10 mg by mouth daily.  1  . neomycin-polymyxin b-dexamethasone (MAXITROL) 3.5-10000-0.1 OINT     . nitrofurantoin (MACRODANTIN) 100 MG capsule Take 1 capsule (100 mg total) by  mouth 3 (three) times daily. 15 capsule 0  . Polyethylene Glycol 3350 (PEG 3350) POWD Take 17 g by mouth.    . primidone (MYSOLINE) 50 MG tablet 2 (two) times daily.   0  . promethazine (PHENERGAN) 25 MG tablet Take by mouth.    . QUEtiapine (SEROQUEL) 25 MG tablet Take by mouth.    . rizatriptan (MAXALT-MLT) 10 MG disintegrating tablet     . tiZANidine (ZANAFLEX) 4 MG capsule Take 4 mg by mouth 2 (two) times daily.    Marland Kitchen levETIRAcetam (KEPPRA) 500 MG tablet TAKE 2 TABLETS IN THE AM, HALF A TABLET AT NOON, HALF A TABLET AT 4PM, 2 TABLETS AT BEDTIME (Patient not taking: No sig reported)  5   No current facility-administered medications on file prior to visit.    Allergies  Allergen Reactions  . Aptiom [Eslicarbazepine]     Causes seizures  . Cedax [Ceftibuten]     Causes vomiting  . Prednisone     Seizures  . Vimpat [Lacosamide]     Causes seizures    Family History  Problem Relation Age of Onset  . Diabetes Father     BP 110/80 (BP Location: Right Arm, Patient Position: Sitting, Cuff Size: Large)   Pulse 83   Ht 5\' 2"  (1.575 m)   Wt 171 lb 6.4 oz (77.7 kg)   SpO2 96%   BMI 31.35 kg/m    Review of Systems Denies LOC/n/v    Objective:   Physical Exam VITAL SIGNS:  See vs page GENERAL: no distress Pulses: dorsalis pedis intact bilat.   MSK: no deformity of the feet CV: no leg edema Skin:  no ulcer on the feet, but there are heavy calluses, and the skin is dry.  normal color and temp on the feet.   Neuro: sensation is intact to touch on the feet.   Ext: there is bilateral onychomycosis of the toenails.   Lab Results  Component Value Date   HGBA1C 8.7 (A) 10/18/2020       Assessment & Plan:  Insulin-requiring type 2 DM: uncontrolled.  We'll change to QD insulin  Patient Instructions  I have sent a prescription to your pharmacy, to increase the Trulicity to 4.5 mg per week, and:  Please stop taking the Novolog, and: Increase the Levemir to 200 units each  morning.    check your blood sugar twice a day.  vary the time of day when you check,  between before the 3 meals, and at bedtime.  also check if you have symptoms of your blood sugar being too high or too low.  please keep a record of the readings and bring it to your next appointment here (or you can bring the meter itself).  You can write it on any piece of paper.  please call us sooner if your blood sugar goes below 70, or if you have a lot of readings over 200.   Please come back for a follow-up appointment in 2 months.

## 2020-10-18 NOTE — Patient Instructions (Addendum)
I have sent a prescription to your pharmacy, to increase the Trulicity to 4.5 mg per week, and:  Please stop taking the Novolog, and: Increase the Levemir to 200 units each morning.    check your blood sugar twice a day.  vary the time of day when you check, between before the 3 meals, and at bedtime.  also check if you have symptoms of your blood sugar being too high or too low.  please keep a record of the readings and bring it to your next appointment here (or you can bring the meter itself).  You can write it on any piece of paper.  please call us sooner if your blood sugar goes below 70, or if you have a lot of readings over 200.   Please come back for a follow-up appointment in 2 months.

## 2020-10-21 ENCOUNTER — Other Ambulatory Visit: Payer: Self-pay | Admitting: Endocrinology

## 2020-12-02 ENCOUNTER — Emergency Department (HOSPITAL_COMMUNITY): Payer: No Typology Code available for payment source

## 2020-12-02 ENCOUNTER — Emergency Department (HOSPITAL_COMMUNITY)
Admission: EM | Admit: 2020-12-02 | Discharge: 2020-12-02 | Disposition: A | Discharge status: Home / Self Care | Payer: No Typology Code available for payment source | Attending: Emergency Medicine | Admitting: Emergency Medicine

## 2020-12-02 DIAGNOSIS — S20219A Contusion of unspecified front wall of thorax, initial encounter: Secondary | ICD-10-CM | POA: Insufficient documentation

## 2020-12-02 DIAGNOSIS — Z794 Long term (current) use of insulin: Secondary | ICD-10-CM | POA: Diagnosis not present

## 2020-12-02 DIAGNOSIS — Z20822 Contact with and (suspected) exposure to covid-19: Secondary | ICD-10-CM | POA: Diagnosis not present

## 2020-12-02 DIAGNOSIS — Y900 Blood alcohol level of less than 20 mg/100 ml: Secondary | ICD-10-CM | POA: Insufficient documentation

## 2020-12-02 DIAGNOSIS — Z7982 Long term (current) use of aspirin: Secondary | ICD-10-CM | POA: Diagnosis not present

## 2020-12-02 DIAGNOSIS — S299XXA Unspecified injury of thorax, initial encounter: Secondary | ICD-10-CM | POA: Diagnosis present

## 2020-12-02 DIAGNOSIS — Y92410 Unspecified street and highway as the place of occurrence of the external cause: Secondary | ICD-10-CM | POA: Diagnosis not present

## 2020-12-02 DIAGNOSIS — S0990XA Unspecified injury of head, initial encounter: Secondary | ICD-10-CM | POA: Diagnosis not present

## 2020-12-02 DIAGNOSIS — E119 Type 2 diabetes mellitus without complications: Secondary | ICD-10-CM | POA: Insufficient documentation

## 2020-12-02 LAB — LACTIC ACID, PLASMA: Lactic Acid, Venous: 3 mmol/L (ref 0.5–1.9)

## 2020-12-02 LAB — URINALYSIS, ROUTINE W REFLEX MICROSCOPIC
Bacteria, UA: NONE SEEN
Bilirubin Urine: NEGATIVE
Glucose, UA: >=500 mg/dL — AB
Hgb urine dipstick: NEGATIVE
Ketones, ur: NEGATIVE mg/dL
Nitrite: NEGATIVE
Protein, ur: NEGATIVE mg/dL
Specific Gravity, Urine: 1.005 (ref 1.005–1.030)
pH: 7 (ref 5.0–8.0)

## 2020-12-02 LAB — ETHANOL: Alcohol, Ethyl (B): <10 mg/dL (ref <10)

## 2020-12-02 LAB — COMPREHENSIVE METABOLIC PANEL
ALT: 28 U/L (ref 0–44)
AST: 25 U/L (ref 15–41)
Albumin: 3.5 g/dL (ref 3.5–5.0)
Alkaline Phosphatase: 95 U/L (ref 38–126)
Anion gap: 11 (ref 5–15)
BUN: 12 mg/dL (ref 8–23)
CO2: 19 mmol/L — ABNORMAL LOW (ref 22–32)
Calcium: 8.6 mg/dL — ABNORMAL LOW (ref 8.9–10.3)
Chloride: 105 mmol/L (ref 98–111)
Creatinine, Ser: 0.87 mg/dL (ref 0.44–1.00)
GFR, Estimated: >60 mL/min (ref >60)
Glucose, Bld: 383 mg/dL — ABNORMAL HIGH (ref 70–99)
Potassium: 3.2 mmol/L — ABNORMAL LOW (ref 3.5–5.1)
Sodium: 135 mmol/L (ref 135–145)
Total Bilirubin: 0.5 mg/dL (ref 0.3–1.2)
Total Protein: 6.3 g/dL — ABNORMAL LOW (ref 6.5–8.1)

## 2020-12-02 LAB — I-STAT CHEM 8, ED
BUN: 11 mg/dL (ref 8–23)
Calcium, Ion: 1.16 mmol/L (ref 1.15–1.40)
Chloride: 106 mmol/L (ref 98–111)
Creatinine, Ser: 0.7 mg/dL (ref 0.44–1.00)
Glucose, Bld: 350 mg/dL — ABNORMAL HIGH (ref 70–99)
HCT: 37 % (ref 36.0–46.0)
Hemoglobin: 12.6 g/dL (ref 12.0–15.0)
Potassium: 3.5 mmol/L (ref 3.5–5.1)
Sodium: 140 mmol/L (ref 135–145)
TCO2: 21 mmol/L — ABNORMAL LOW (ref 22–32)

## 2020-12-02 LAB — PROTIME-INR
INR: 1 (ref 0.8–1.2)
Prothrombin Time: 13.3 seconds (ref 11.4–15.2)

## 2020-12-02 LAB — CBC
HCT: 38.5 % (ref 36.0–46.0)
Hemoglobin: 12.4 g/dL (ref 12.0–15.0)
MCH: 29.2 pg (ref 26.0–34.0)
MCHC: 32.2 g/dL (ref 30.0–36.0)
MCV: 90.6 fL (ref 80.0–100.0)
Platelets: 189 10*3/uL (ref 150–400)
RBC: 4.25 MIL/uL (ref 3.87–5.11)
RDW: 13.6 % (ref 11.5–15.5)
WBC: 7.7 10*3/uL (ref 4.0–10.5)
nRBC: 0 % (ref 0.0–0.2)

## 2020-12-02 LAB — RESP PANEL BY RT-PCR (FLU A&B, COVID) ARPGX2
Influenza A by PCR: NEGATIVE
Influenza B by PCR: NEGATIVE
SARS Coronavirus 2 by RT PCR: NEGATIVE

## 2020-12-02 LAB — SAMPLE TO BLOOD BANK

## 2020-12-02 MED ORDER — ACETAMINOPHEN 500 MG PO TABS
1000.0000 mg | ORAL_TABLET | Freq: Once | ORAL | Status: AC
  Administered 2020-12-02: 1000 mg via ORAL
  Filled 2020-12-02: qty 2

## 2020-12-02 MED ORDER — SODIUM CHLORIDE 0.9 % IV SOLN: INTRAVENOUS | Status: DC

## 2020-12-02 NOTE — ED Notes (Signed)
RN called CT, was told they do not have scanner available, and will call back.
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
## 2020-12-24 ENCOUNTER — Ambulatory Visit (INDEPENDENT_AMBULATORY_CARE_PROVIDER_SITE_OTHER): Payer: No Typology Code available for payment source | Admitting: Endocrinology

## 2020-12-24 ENCOUNTER — Other Ambulatory Visit: Payer: Self-pay

## 2020-12-24 ENCOUNTER — Other Ambulatory Visit: Payer: Self-pay | Admitting: Endocrinology

## 2020-12-24 VITALS — BP 120/78 | HR 79 | Ht 62.0 in | Wt 195.0 lb

## 2020-12-24 DIAGNOSIS — E1143 Type 2 diabetes mellitus with diabetic autonomic (poly)neuropathy: Secondary | ICD-10-CM

## 2020-12-24 DIAGNOSIS — Z794 Long term (current) use of insulin: Secondary | ICD-10-CM | POA: Diagnosis not present

## 2020-12-24 LAB — POCT GLYCOSYLATED HEMOGLOBIN (HGB A1C): Hemoglobin A1C: 9.3 % — AB (ref 4.0–5.6)

## 2020-12-24 MED ORDER — BASAGLAR KWIKPEN 100 UNIT/ML ~~LOC~~ SOPN: 150.0000 [IU] | PEN_INJECTOR | SUBCUTANEOUS | 3 refills | Status: DC

## 2020-12-24 NOTE — Patient Instructions (Addendum)
Please continue the same Trulicity, and: change the Levemir to Basaglar, 150 units each morning.    check your blood sugar twice a day.  vary the time of day when you check, between before the 3 meals, and at bedtime.  also check if you have symptoms of your blood sugar being too high or too low.  please keep a record of the readings and bring it to your next appointment here (or you can bring the meter itself).  You can write it on any piece of paper.  please call us sooner if your blood sugar goes below 70, or if you have a lot of readings over 200.   Please come back for a follow-up appointment in 2 months.
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
## 2021-01-06 ENCOUNTER — Encounter: Payer: Self-pay | Admitting: Endocrinology

## 2021-01-10 NOTE — Telephone Encounter (Signed)
Spoke with pt to let her know that she will need to stay on Levemir but she is welcomed to speak with Everardo All about this when he comes off vacation.

## 2021-02-25 ENCOUNTER — Other Ambulatory Visit: Payer: Self-pay

## 2021-02-25 ENCOUNTER — Telehealth: Payer: Self-pay | Admitting: Endocrinology

## 2021-02-25 ENCOUNTER — Ambulatory Visit (INDEPENDENT_AMBULATORY_CARE_PROVIDER_SITE_OTHER): Payer: No Typology Code available for payment source | Admitting: Endocrinology

## 2021-02-25 VITALS — BP 140/80 | HR 78 | Ht 62.0 in | Wt 166.5 lb

## 2021-02-25 DIAGNOSIS — Z794 Long term (current) use of insulin: Secondary | ICD-10-CM | POA: Diagnosis not present

## 2021-02-25 DIAGNOSIS — E1143 Type 2 diabetes mellitus with diabetic autonomic (poly)neuropathy: Secondary | ICD-10-CM | POA: Diagnosis not present

## 2021-02-25 LAB — POCT GLYCOSYLATED HEMOGLOBIN (HGB A1C): Hemoglobin A1C: 8.8 % — AB (ref 4.0–5.6)

## 2021-02-25 NOTE — Progress Notes (Signed)
Subjective:    Patient ID: Emma Payne, female    DOB: 1960/12/21, 60 y.o.   MRN: [id number]  HPI Pt returns for f/u of diabetes mellitus:  DM type: Insulin-requiring type 2. Dx'ed: 2002 Complications: PN and GP.   Therapy: insulin since early 2018, and Trulicity.   GDM: never (G0) DKA: never Severe hypoglycemia: never.  Pancreatitis: never Pancreatic imaging: never Other: she declined to continue multiple daily injections; she declines weight loss surgery; she declines insulin pump and continuous glucose monitor; she did not tolerate [place] or Invokana (yeast infect); Levemir was changed to glargine, due to pattern of cbg's.   Interval history: she brings a record of her cbg's which I have reviewed today.  cbg's vary from 47-257.  It is in general lowest in the afternoon, and highest fasting.    Past Medical History:  Diagnosis Date   Diabetes (HCC)    No past surgical history on file.  Social History   Socioeconomic History   Marital status: Divorced    Spouse name: Not on file   Number of children: Not on file   Years of education: Not on file   Highest education level: Not on file  Occupational History   Not on file  Tobacco Use   Smoking status: Never   Smokeless tobacco: Never  Substance and Sexual Activity   Alcohol use: No   Drug use: Not on file   Sexual activity: Not on file  Other Topics Concern   Not on file  Social History Narrative   Not on file   Social Determinants of Health   Financial Resource Strain: Not on file  Food Insecurity: Not on file  Transportation Needs: Not on file  Physical Activity: Not on file  Stress: Not on file  Social Connections: Not on file  Intimate Partner Violence: Not on file    Current Outpatient Medications on File Prior to Visit  Medication Sig Dispense Refill   acetaZOLAMIDE (DIAMOX) 250 MG tablet Take 250 mg by mouth at bedtime.     albuterol (PROVENTIL HFA;VENTOLIN HFA) 108 (90 Base) MCG/ACT inhaler  Inhale 2 puffs into the lungs every 6 (six) hours as needed for wheezing or shortness of breath.     Ascorbic Acid (VITAMIN C) 1000 MG tablet Take 1,000 mg by mouth daily.     aspirin 81 MG chewable tablet Chew 81 mg by mouth daily.     Atogepant (QULIPTA) 60 MG TABS Take 60 mg by mouth daily.     B-D UF III MINI PEN NEEDLES 31G X 5 MM MISC 1 DEVICE BY OTHER ROUTE SEE ADMIN INSTRUCTIONS. 1 NEEDLE 5 TIMES PER DAY 500 each 3   BD INTEGRA SYRINGE 25G X 1" 3 ML MISC      BD PEN NEEDLE NANO 2ND GEN 32G X 4 MM MISC USE 4 TIMES A DAY 400 each 3   clobetasol cream (TEMOVATE) 0.05 % Apply 1 application topically 2 (two) times daily.     clonazePAM (KLONOPIN) 1 MG tablet Take 0.5-1.5 mg by mouth See admin instructions. Taking 0.5 mg in the AM and 1.5 mg at bedtime     cyanocobalamin (,VITAMIN B-12,) 1000 MCG/ML injection Inject 1,000 mcg into the muscle every 30 (thirty) days.     Dextromethorphan-Quinidine 20-10 MG CAPS Take 1 capsule by mouth 2 (two) times daily.     dicyclomine (BENTYL) 10 MG capsule Take 10 mg by mouth 2 (two) times daily as needed for spasms.  Dulaglutide (TRULICITY) 4.5 MG/0.5ML SOPN Inject 4.5 mg as directed once a week. 6 mL 3   FLUoxetine (PROZAC) 40 MG capsule Take 80 mg by mouth every morning.     Insulin Glargine (BASAGLAR KWIKPEN) 100 UNIT/ML Inject 150 Units into the skin every morning. 150 mL 3   irbesartan (AVAPRO) 150 MG tablet Take 150 mg by mouth daily.  0   ketoconazole (NIZORAL) 2 % cream Apply 1 application topically 2 (two) times daily.     ketoconazole (NIZORAL) 2 % shampoo Apply 1 application topically 2 (two) times a week.     lamoTRIgine (LAMICTAL) 200 MG tablet 2 (two) times daily.      montelukast (SINGULAIR) 10 MG tablet Take 10 mg by mouth at bedtime.  1   neomycin-polymyxin b-dexamethasone (MAXITROL) 3.5-10000-0.1 OINT Place 1 application into both eyes once a week. Apply to eyelids     ondansetron (ZOFRAN-ODT) 8 MG disintegrating tablet Take 8 mg by  mouth every 8 (eight) hours as needed for nausea/vomiting.     Polyethylene Glycol 3350 (PEG 3350) POWD Take 51 g by mouth daily as needed (constipation). 3 scoops     QUEtiapine (SEROQUEL) 25 MG tablet Take 50 mg by mouth at bedtime.     rizatriptan (MAXALT-MLT) 10 MG disintegrating tablet Take 10 mg by mouth daily as needed for migraine.     rosuvastatin (CRESTOR) 10 MG tablet Take 10 mg by mouth at bedtime.     Wheat Dextrin (BENEFIBER DRINK MIX PO) Take 3 Scoops by mouth daily as needed (constipation).     No current facility-administered medications on file prior to visit.    Allergies  Allergen Reactions   Aptiom [Eslicarbazepine]     Causes seizures   Cedax [Ceftibuten]     Causes vomiting   Prednisone     Seizures   Vimpat [Lacosamide]     Causes seizures    Family History  Problem Relation Age of Onset   Diabetes Father     BP 140/80 (BP Location: Right Arm, Patient Position: Sitting, Cuff Size: Normal)   Pulse 78   Ht 5\' 2"  (1.575 m)   Wt 166 lb 8 oz (75.5 kg)   SpO2 98%   BMI 30.45 kg/m    Review of Systems     Objective:   Physical Exam Pulses: dorsalis pedis intact bilat.   MSK: no deformity of the feet CV: no leg edema Skin:  no ulcer on the feet, but there are heavy calluses, and the skin is dry.  normal color and temp on the feet.   Neuro: sensation is intact to touch on the feet.   Ext: there is bilateral onychomycosis of the toenails.    Lab Results  Component Value Date   CREATININE 0.70 12/02/2020   BUN 11 12/02/2020   NA 140 12/02/2020   K 3.5 12/02/2020   CL 106 12/02/2020   CO2 19 (L) 12/02/2020   A1c=8.8%     Assessment & Plan:  Insulin-requiring type 2 DM: uncontrolled Hypoglycemia, due to insulin: Based on the pattern of her cbg's, she needs a slower-acting qam insulin.   Patient Instructions  Please continue the same Trulicity, and: change the Levemir to Basaglar, 150 units each morning.  I have asked our pharmacy to help 12/04/2020  with this.   Blood tests are requested for you today.  We'll let you know about the results.  check your blood sugar twice a day.  vary the time of day when you  check, between before the 3 meals, and at bedtime.  also check if you have symptoms of your blood sugar being too high or too low.  please keep a record of the readings and bring it to your next appointment here (or you can bring the meter itself).  You can write it on any piece of paper.  please call us sooner if your blood sugar goes below 70, or if you have a lot of readings over 200.   Please come back for a follow-up appointment in 2 months.

## 2021-02-25 NOTE — Telephone Encounter (Signed)
Pt says she was told at pharmacy that only levemir is covered--no brand of glargine.  With other pts, we have found this not to be true.  Please help.

## 2021-02-25 NOTE — Patient Instructions (Addendum)
Please continue the same Trulicity, and: change the Levemir to Basaglar, 150 units each morning.  I have asked our pharmacy to help [place] with this.   Blood tests are requested for you today.  We'll let you know about the results.  check your blood sugar twice a day.  vary the time of day when you check, between before the 3 meals, and at bedtime.  also check if you have symptoms of your blood sugar being too high or too low.  please keep a record of the readings and bring it to your next appointment here (or you can bring the meter itself).  You can write it on any piece of paper.  please call us sooner if your blood sugar goes below 70, or if you have a lot of readings over 200.   Please come back for a follow-up appointment in 2 months.

## 2021-02-28 ENCOUNTER — Other Ambulatory Visit (HOSPITAL_COMMUNITY): Payer: Self-pay

## 2021-02-28 ENCOUNTER — Other Ambulatory Visit: Payer: Self-pay | Admitting: Endocrinology

## 2021-02-28 MED ORDER — BASAGLAR KWIKPEN 100 UNIT/ML ~~LOC~~ SOPN: 150.0000 [IU] | PEN_INJECTOR | SUBCUTANEOUS | 11 refills | Status: DC

## 2021-03-01 ENCOUNTER — Other Ambulatory Visit: Payer: Self-pay

## 2021-03-01 DIAGNOSIS — E1143 Type 2 diabetes mellitus with diabetic autonomic (poly)neuropathy: Secondary | ICD-10-CM

## 2021-03-01 DIAGNOSIS — Z794 Long term (current) use of insulin: Secondary | ICD-10-CM

## 2021-03-01 NOTE — Addendum Note (Signed)
Addended by: Adline Mango I on: 03/01/2021 08:19 AM   Modules accepted: Orders

## 2021-03-02 LAB — BASIC METABOLIC PANEL
BUN: 15 mg/dL (ref 7–25)
CO2: 19 mmol/L — ABNORMAL LOW (ref 20–32)
Calcium: 9.1 mg/dL (ref 8.6–10.4)
Chloride: 105 mmol/L (ref 98–110)
Creat: 0.85 mg/dL (ref 0.50–1.05)
Glucose, Bld: 397 mg/dL — ABNORMAL HIGH (ref 65–99)
Potassium: 4.1 mmol/L (ref 3.5–5.3)
Sodium: 136 mmol/L (ref 135–146)

## 2021-03-02 LAB — T4, FREE: Free T4: 1.1 ng/dL (ref 0.8–1.8)

## 2021-03-02 LAB — TSH: TSH: 1.6 mIU/L (ref 0.40–4.50)

## 2021-05-04 ENCOUNTER — Other Ambulatory Visit: Payer: Self-pay

## 2021-05-04 ENCOUNTER — Telehealth: Payer: Self-pay | Admitting: Nutrition

## 2021-05-04 ENCOUNTER — Ambulatory Visit (INDEPENDENT_AMBULATORY_CARE_PROVIDER_SITE_OTHER): Payer: No Typology Code available for payment source | Admitting: Endocrinology

## 2021-05-04 VITALS — BP 140/80 | HR 81 | Ht 62.0 in | Wt 162.8 lb

## 2021-05-04 DIAGNOSIS — E1143 Type 2 diabetes mellitus with diabetic autonomic (poly)neuropathy: Secondary | ICD-10-CM | POA: Diagnosis not present

## 2021-05-04 DIAGNOSIS — Z794 Long term (current) use of insulin: Secondary | ICD-10-CM

## 2021-05-04 LAB — POCT GLYCOSYLATED HEMOGLOBIN (HGB A1C): Hemoglobin A1C: 10.2 % — AB (ref 4.0–5.6)

## 2021-05-04 MED ORDER — INSULIN DETEMIR 100 UNIT/ML FLEXPEN
160.0000 [IU] | PEN_INJECTOR | SUBCUTANEOUS | 3 refills | Status: DC
Start: 2021-05-04 — End: 2021-05-20

## 2021-05-04 NOTE — Patient Instructions (Addendum)
Please continue the same Trulicity and Levemir.   check your blood sugar twice a day.  vary the time of day when you check, between before the 3 meals, and at bedtime.  also check if you have symptoms of your blood sugar being too high or too low.  please keep a record of the readings and bring it to your next appointment here (or you can bring the meter itself).  You can write it on any piece of paper.  please call us sooner if your blood sugar goes below 70, or if you have a lot of readings over 200.   We are placing a continuous glucose monitor today.   Please come back for a follow-up appointment in 2 weeks.

## 2021-05-04 NOTE — Progress Notes (Signed)
Subjective:    Patient ID: Emma Payne, female    DOB: 1961-03-07, 60 y.o.   MRN: [id number]  HPI Pt returns for f/u of diabetes mellitus:  DM type: Insulin-requiring type 2. Dx'ed: 2002 Complications: PN and GP.   Therapy: insulin since early 2018, and Trulicity.   GDM: never (G0) DKA: never Severe hypoglycemia: never.  Pancreatitis: never Pancreatic imaging: never Other: she declined to continue multiple daily injections; she declines weight loss surgery; she declines insulin pump and continuous glucose monitor; she did not tolerate [place] or Invokana (yeast); Levemir was changed to glargine, due to pattern of cbg's, but ins declined to continue.     Interval history: She was seen in ER, after missing her insulin x a few days.  She still takes Levemir.  She says ins covers it, and she does not miss it.  no cbg record, but states cbg's vary from 73-190.  She checks fasting only.   Past Medical History:  Diagnosis Date   Diabetes (HCC)     No past surgical history on file.  Social History   Socioeconomic History   Marital status: Divorced    Spouse name: Not on file   Number of children: Not on file   Years of education: Not on file   Highest education level: Not on file  Occupational History   Not on file  Tobacco Use   Smoking status: Never   Smokeless tobacco: Never  Substance and Sexual Activity   Alcohol use: No   Drug use: Not on file   Sexual activity: Not on file  Other Topics Concern   Not on file  Social History Narrative   Not on file   Social Determinants of Health   Financial Resource Strain: Not on file  Food Insecurity: Not on file  Transportation Needs: Not on file  Physical Activity: Not on file  Stress: Not on file  Social Connections: Not on file  Intimate Partner Violence: Not on file    Current Outpatient Medications on File Prior to Visit  Medication Sig Dispense Refill   acetaZOLAMIDE (DIAMOX) 250 MG tablet Take 250 mg by mouth at  bedtime.     albuterol (PROVENTIL HFA;VENTOLIN HFA) 108 (90 Base) MCG/ACT inhaler Inhale 2 puffs into the lungs every 6 (six) hours as needed for wheezing or shortness of breath.     Ascorbic Acid (VITAMIN C) 1000 MG tablet Take 1,000 mg by mouth daily.     aspirin 81 MG chewable tablet Chew 81 mg by mouth daily.     Atogepant (QULIPTA) 60 MG TABS Take 60 mg by mouth daily.     B-D UF III MINI PEN NEEDLES 31G X 5 MM MISC 1 DEVICE BY OTHER ROUTE SEE ADMIN INSTRUCTIONS. 1 NEEDLE 5 TIMES PER DAY 500 each 3   BD INTEGRA SYRINGE 25G X 1" 3 ML MISC      BD PEN NEEDLE NANO 2ND GEN 32G X 4 MM MISC USE 4 TIMES A DAY 400 each 3   clobetasol cream (TEMOVATE) 0.05 % Apply 1 application topically 2 (two) times daily.     clonazePAM (KLONOPIN) 1 MG tablet Take 0.5-1.5 mg by mouth See admin instructions. Taking 0.5 mg in the AM and 1.5 mg at bedtime     cyanocobalamin (,VITAMIN B-12,) 1000 MCG/ML injection Inject 1,000 mcg into the muscle every 30 (thirty) days.     Dextromethorphan-Quinidine 20-10 MG CAPS Take 1 capsule by mouth 2 (two) times daily.  dicyclomine (BENTYL) 10 MG capsule Take 10 mg by mouth 2 (two) times daily as needed for spasms.     Dulaglutide (TRULICITY) 4.5 MG/0.5ML SOPN Inject 4.5 mg as directed once a week. 6 mL 3   FLUoxetine (PROZAC) 40 MG capsule Take 80 mg by mouth every morning.     irbesartan (AVAPRO) 150 MG tablet Take 150 mg by mouth daily.  0   ketoconazole (NIZORAL) 2 % cream Apply 1 application topically 2 (two) times daily.     ketoconazole (NIZORAL) 2 % shampoo Apply 1 application topically 2 (two) times a week.     lamoTRIgine (LAMICTAL) 200 MG tablet 2 (two) times daily.      montelukast (SINGULAIR) 10 MG tablet Take 10 mg by mouth at bedtime.  1   neomycin-polymyxin b-dexamethasone (MAXITROL) 3.5-10000-0.1 OINT Place 1 application into both eyes once a week. Apply to eyelids     ondansetron (ZOFRAN-ODT) 8 MG disintegrating tablet Take 8 mg by mouth every 8 (eight)  hours as needed for nausea/vomiting.     Polyethylene Glycol 3350 (PEG 3350) POWD Take 51 g by mouth daily as needed (constipation). 3 scoops     QUEtiapine (SEROQUEL) 25 MG tablet Take 50 mg by mouth at bedtime.     rizatriptan (MAXALT-MLT) 10 MG disintegrating tablet Take 10 mg by mouth daily as needed for migraine.     rosuvastatin (CRESTOR) 10 MG tablet Take 10 mg by mouth at bedtime.     Wheat Dextrin (BENEFIBER DRINK MIX PO) Take 3 Scoops by mouth daily as needed (constipation).     No current facility-administered medications on file prior to visit.    Allergies  Allergen Reactions   Aptiom [Eslicarbazepine]     Causes seizures   Cedax [Ceftibuten]     Causes vomiting   Prednisone     Seizures   Vimpat [Lacosamide]     Causes seizures    Family History  Problem Relation Age of Onset   Diabetes Father     BP 140/80   Pulse 81   Ht 5\' 2"  (1.575 m)   Wt 162 lb 12.8 oz (73.8 kg)   SpO2 98%   BMI 29.78 kg/m   Review of Systems She denies hypoglycemia.      Objective:   Physical Exam    Lab Results  Component Value Date   HGBA1C 10.2 (A) 05/04/2021      Assessment & Plan:  Insulin-requiring type 2 DM: uncontrolled.  I need continuous glucose monitor info in order to safely adjust meds.   Patient Instructions  Please continue the same Trulicity and Levemir.   check your blood sugar twice a day.  vary the time of day when you check, between before the 3 meals, and at bedtime.  also check if you have symptoms of your blood sugar being too high or too low.  please keep a record of the readings and bring it to your next appointment here (or you can bring the meter itself).  You can write it on any piece of paper.  please call 05/06/2021 sooner if your blood sugar goes below 70, or if you have a lot of readings over 200.   We are placing a continuous glucose monitor today.   Please come back for a follow-up appointment in 2 weeks.

## 2021-05-04 NOTE — Telephone Encounter (Signed)
Professional CGM sensor was put on patient on her right arm.  Lot # [id number] Exp.: 10/02/21.

## 2021-05-12 ENCOUNTER — Telehealth: Payer: Self-pay | Admitting: Dietician

## 2021-05-12 NOTE — Telephone Encounter (Signed)
Returned patient call. She states that her FreeStyle Libre Pro fell off 05/11/2021. This was placed 05/04/2021. Instructed patient to put in a ziploc and bring to her next appointment.  Oran Rein, RD, LDN, CDCES

## 2021-05-20 ENCOUNTER — Ambulatory Visit (INDEPENDENT_AMBULATORY_CARE_PROVIDER_SITE_OTHER): Payer: No Typology Code available for payment source | Admitting: Endocrinology

## 2021-05-20 ENCOUNTER — Other Ambulatory Visit: Payer: Self-pay

## 2021-05-20 VITALS — BP 144/96 | HR 83 | Ht 62.0 in | Wt 166.8 lb

## 2021-05-20 DIAGNOSIS — E1143 Type 2 diabetes mellitus with diabetic autonomic (poly)neuropathy: Secondary | ICD-10-CM

## 2021-05-20 DIAGNOSIS — Z794 Long term (current) use of insulin: Secondary | ICD-10-CM

## 2021-05-20 MED ORDER — INSULIN DETEMIR 100 UNIT/ML FLEXPEN: 180.0000 [IU] | PEN_INJECTOR | SUBCUTANEOUS | 3 refills | Status: DC

## 2021-05-20 NOTE — Patient Instructions (Addendum)
Please continue the same Trulicity and:  I have sent a prescription to your pharmacy, to increase the Levemir.   check your blood sugar twice a day.  vary the time of day when you check, between before the 3 meals, and at bedtime.  also check if you have symptoms of your blood sugar being too high or too low.  please keep a record of the readings and bring it to your next appointment here (or you can bring the meter itself).  You can write it on any piece of paper.  please call us sooner if your blood sugar goes below 70, or if you have a lot of readings over 200.   Blood tests are requested for you today.  We'll let you know about the results.   Whenever the blood sugar does low, try to think about why it is (example: lunch is delayed).    Please come back for a follow-up appointment in 2 month.

## 2021-05-20 NOTE — Progress Notes (Signed)
Subjective:    Patient ID: Emma Payne, female    DOB: Jan 28, 1961, 60 y.o.   MRN: [id number]  HPI Pt returns for f/u of diabetes mellitus:  DM type: Insulin-requiring type 2. Dx'ed: 2002 Complications: PN and GP.   Therapy: insulin since early 2018, and Trulicity.   GDM: never (G0) DKA: never Severe hypoglycemia: never.  Pancreatitis: never Pancreatic imaging: never Other: she declined to continue multiple daily injections; she declines weight loss surgery; she declines insulin pump and continuous glucose monitor; she did not tolerate [place] or Invokana (yeast); Levemir was changed to glargine, due to pattern of cbg's, but ins declined to continue.   Interval history: She takes meds as rx'ed.  I reviewed continuous glucose monitor data.  Glucose varies from 200-400.  It is in general highest at 11AM, and lowest at 7-9AM.  It increases 9-11 AM.  Otherwise, there is little trend throughout the day.  Glucose varies widely at all times of day.   Pt says she does not know why glucose varies so widely.  She says her insulin inject technique is good, and insulin does not leak out from the skin Past Medical History:  Diagnosis Date   Diabetes (HCC)     No past surgical history on file.  Social History   Socioeconomic History   Marital status: Divorced    Spouse name: Not on file   Number of children: Not on file   Years of education: Not on file   Highest education level: Not on file  Occupational History   Not on file  Tobacco Use   Smoking status: Never   Smokeless tobacco: Never  Substance and Sexual Activity   Alcohol use: No   Drug use: Not on file   Sexual activity: Not on file  Other Topics Concern   Not on file  Social History Narrative   Not on file   Social Determinants of Health   Financial Resource Strain: Not on file  Food Insecurity: Not on file  Transportation Needs: Not on file  Physical Activity: Not on file  Stress: Not on file  Social Connections:  Not on file  Intimate Partner Violence: Not on file    Current Outpatient Medications on File Prior to Visit  Medication Sig Dispense Refill   acetaZOLAMIDE (DIAMOX) 250 MG tablet Take 250 mg by mouth at bedtime.     albuterol (PROVENTIL HFA;VENTOLIN HFA) 108 (90 Base) MCG/ACT inhaler Inhale 2 puffs into the lungs every 6 (six) hours as needed for wheezing or shortness of breath.     Ascorbic Acid (VITAMIN C) 1000 MG tablet Take 1,000 mg by mouth daily.     aspirin 81 MG chewable tablet Chew 81 mg by mouth daily.     Atogepant (QULIPTA) 60 MG TABS Take 60 mg by mouth daily.     B-D UF III MINI PEN NEEDLES 31G X 5 MM MISC 1 DEVICE BY OTHER ROUTE SEE ADMIN INSTRUCTIONS. 1 NEEDLE 5 TIMES PER DAY 500 each 3   BD INTEGRA SYRINGE 25G X 1" 3 ML MISC      BD PEN NEEDLE NANO 2ND GEN 32G X 4 MM MISC USE 4 TIMES A DAY 400 each 3   clobetasol cream (TEMOVATE) 0.05 % Apply 1 application topically 2 (two) times daily.     clonazePAM (KLONOPIN) 1 MG tablet Take 0.5-1.5 mg by mouth See admin instructions. Taking 0.5 mg in the AM and 1.5 mg at bedtime     cyanocobalamin (,VITAMIN  B-12,) 1000 MCG/ML injection Inject 1,000 mcg into the muscle every 30 (thirty) days.     Dextromethorphan-Quinidine 20-10 MG CAPS Take 1 capsule by mouth 2 (two) times daily.     dicyclomine (BENTYL) 10 MG capsule Take 10 mg by mouth 2 (two) times daily as needed for spasms.     Dulaglutide (TRULICITY) 4.5 MG/0.5ML SOPN Inject 4.5 mg as directed once a week. 6 mL 3   FLUoxetine (PROZAC) 40 MG capsule Take 80 mg by mouth every morning.     irbesartan (AVAPRO) 150 MG tablet Take 150 mg by mouth daily.  0   ketoconazole (NIZORAL) 2 % cream Apply 1 application topically 2 (two) times daily.     ketoconazole (NIZORAL) 2 % shampoo Apply 1 application topically 2 (two) times a week.     lamoTRIgine (LAMICTAL) 200 MG tablet 2 (two) times daily.      montelukast (SINGULAIR) 10 MG tablet Take 10 mg by mouth at bedtime.  1    neomycin-polymyxin b-dexamethasone (MAXITROL) 3.5-10000-0.1 OINT Place 1 application into both eyes once a week. Apply to eyelids     ondansetron (ZOFRAN-ODT) 8 MG disintegrating tablet Take 8 mg by mouth every 8 (eight) hours as needed for nausea/vomiting.     Polyethylene Glycol 3350 (PEG 3350) POWD Take 51 g by mouth daily as needed (constipation). 3 scoops     QUEtiapine (SEROQUEL) 25 MG tablet Take 50 mg by mouth at bedtime.     rizatriptan (MAXALT-MLT) 10 MG disintegrating tablet Take 10 mg by mouth daily as needed for migraine.     rosuvastatin (CRESTOR) 10 MG tablet Take 10 mg by mouth at bedtime.     Wheat Dextrin (BENEFIBER DRINK MIX PO) Take 3 Scoops by mouth daily as needed (constipation).     No current facility-administered medications on file prior to visit.    Allergies  Allergen Reactions   Aptiom [Eslicarbazepine]     Causes seizures   Cedax [Ceftibuten]     Causes vomiting   Prednisone     Seizures   Vimpat [Lacosamide]     Causes seizures    Family History  Problem Relation Age of Onset   Diabetes Father     BP (!) 144/96 (BP Location: Left Arm, Patient Position: Sitting, Cuff Size: Normal)    Pulse 83    Ht 5\' 2"  (1.575 m)    Wt 166 lb 12.8 oz (75.7 kg)    SpO2 95%    BMI 30.51 kg/m   Review of Systems     Objective:   Physical Exam    Lab Results  Component Value Date   HGBA1C 10.2 (A) 05/04/2021      Assessment & Plan:  Insulin-requiring type 2 DM: uncontrolled  Patient Instructions  Please continue the same Trulicity and:  I have sent a prescription to your pharmacy, to increase the Levemir.   check your blood sugar twice a day.  vary the time of day when you check, between before the 3 meals, and at bedtime.  also check if you have symptoms of your blood sugar being too high or too low.  please keep a record of the readings and bring it to your next appointment here (or you can bring the meter itself).  You can write it on any piece of paper.   please call 05/06/2021 sooner if your blood sugar goes below 70, or if you have a lot of readings over 200.   Blood tests are requested for  you today.  We'll let you know about the results.   Whenever the blood sugar does low, try to think about why it is (example: lunch is delayed).    Please come back for a follow-up appointment in 2 month.

## 2021-05-25 LAB — FRUCTOSAMINE: Fructosamine: 325 umol/L — ABNORMAL HIGH (ref 205–285)

## 2021-06-03 ENCOUNTER — Other Ambulatory Visit: Payer: Self-pay | Admitting: Endocrinology

## 2021-06-07 ENCOUNTER — Other Ambulatory Visit: Payer: Self-pay | Admitting: Endocrinology

## 2021-06-07 MED ORDER — OZEMPIC (2 MG/DOSE) 8 MG/3ML ~~LOC~~ SOPN: 2.0000 mg | PEN_INJECTOR | SUBCUTANEOUS | 3 refills | Status: DC

## 2021-06-09 ENCOUNTER — Telehealth: Payer: Self-pay | Admitting: Endocrinology

## 2021-06-09 NOTE — Telephone Encounter (Signed)
Patient called needing instruction on medication. Please call her at [phone].

## 2021-06-10 NOTE — Telephone Encounter (Signed)
Spoke with pt to let her know that per Everardo All, he stated the change was from Trulicity to [place] due to insurance or the availability at the time.

## 2021-07-15 ENCOUNTER — Other Ambulatory Visit: Payer: Self-pay

## 2021-07-15 ENCOUNTER — Ambulatory Visit: Payer: No Typology Code available for payment source | Admitting: Endocrinology

## 2021-07-15 VITALS — BP 140/84 | HR 81 | Ht 62.0 in | Wt 161.6 lb

## 2021-07-15 DIAGNOSIS — Z794 Long term (current) use of insulin: Secondary | ICD-10-CM | POA: Diagnosis not present

## 2021-07-15 DIAGNOSIS — E1143 Type 2 diabetes mellitus with diabetic autonomic (poly)neuropathy: Secondary | ICD-10-CM | POA: Diagnosis not present

## 2021-07-15 LAB — POCT GLYCOSYLATED HEMOGLOBIN (HGB A1C): Hemoglobin A1C: 9.2 % — AB (ref 4.0–5.6)

## 2021-07-15 MED ORDER — FREESTYLE LIBRE 2 SENSOR MISC: 1.0000 | 3 refills | Status: DC

## 2021-07-15 MED ORDER — FREESTYLE LIBRE 2 READER DEVI: 1.0000 | Freq: Once | 1 refills | Status: AC

## 2021-07-15 NOTE — Progress Notes (Signed)
Subjective:    Patient ID: Emma Payne, female    DOB: [birth year], 61 y.o.   MRN: [id number]  HPI Pt returns for f/u of diabetes mellitus:  DM type: Insulin-requiring type 2. Dx'ed: 123XX123 Complications: PN and GP.   Therapy: insulin since early 2018, and Ozempic.   GDM: never (G0) DKA: never Severe hypoglycemia: never.  Pancreatitis: never Pancreatic imaging: never SDOH: pt says PTSD is compromising the care of her DM.   Other: she declined to continue multiple daily injections; she declines weight loss surgery; she declines insulin pump and continuous glucose monitor; she did not tolerate [place] or Invokana (yeast); Levemir was changed to glargine, due to pattern of cbg's, but ins declined to continue.   Interval history: Pt says she is missing Ozempic doses, due to lack of availability.  However, she has recently been able to obtain.  She seldom checks cbg.   Past Medical History:  Diagnosis Date   Diabetes ([place])     No past surgical history on file.  Social History   Socioeconomic History   Marital status: Divorced    Spouse name: Not on file   Number of children: Not on file   Years of education: Not on file   Highest education level: Not on file  Occupational History   Not on file  Tobacco Use   Smoking status: Never   Smokeless tobacco: Never  Substance and Sexual Activity   Alcohol use: No   Drug use: Not on file   Sexual activity: Not on file  Other Topics Concern   Not on file  Social History Narrative   Not on file   Social Determinants of Health   Financial Resource Strain: Not on file  Food Insecurity: Not on file  Transportation Needs: Not on file  Physical Activity: Not on file  Stress: Not on file  Social Connections: Not on file  Intimate Partner Violence: Not on file    Current Outpatient Medications on File Prior to Visit  Medication Sig Dispense Refill   acetaZOLAMIDE (DIAMOX) 250 MG tablet Take 250 mg by mouth at bedtime.      albuterol (PROVENTIL HFA;VENTOLIN HFA) 108 (90 Base) MCG/ACT inhaler Inhale 2 puffs into the lungs every 6 (six) hours as needed for wheezing or shortness of breath.     Ascorbic Acid (VITAMIN C) 1000 MG tablet Take 1,000 mg by mouth daily.     aspirin 81 MG chewable tablet Chew 81 mg by mouth daily.     Atogepant (QULIPTA) 60 MG TABS Take 60 mg by mouth daily.     B-D UF III MINI PEN NEEDLES 31G X 5 MM MISC 1 DEVICE BY OTHER ROUTE SEE ADMIN INSTRUCTIONS. 1 NEEDLE 5 TIMES PER DAY 500 each 3   BD INTEGRA SYRINGE 25G X 1" 3 ML MISC      BD PEN NEEDLE NANO 2ND GEN 32G X 4 MM MISC USE 4 TIMES A DAY 400 each 3   clobetasol cream (TEMOVATE) [id number] % Apply 1 application topically 2 (two) times daily.     clonazePAM (KLONOPIN) 1 MG tablet Take 0.5-1.5 mg by mouth See admin instructions. Taking 0.5 mg in the AM and 1.5 mg at bedtime     cyanocobalamin (,VITAMIN B-12,) 1000 MCG/ML injection Inject 1,000 mcg into the muscle every 30 (thirty) days.     Dextromethorphan-Quinidine 20-10 MG CAPS Take 1 capsule by mouth 2 (two) times daily.     dicyclomine (BENTYL) 10 MG capsule Take 10  mg by mouth 2 (two) times daily as needed for spasms.     FLUoxetine (PROZAC) 40 MG capsule Take 80 mg by mouth every morning.     insulin detemir (LEVEMIR FLEXPEN) 100 UNIT/ML FlexPen Inject 180 Units into the skin every morning. And pen needles 1/day 180 mL 3   irbesartan (AVAPRO) 150 MG tablet Take 150 mg by mouth daily.  0   ketoconazole (NIZORAL) 2 % cream Apply 1 application topically 2 (two) times daily.     ketoconazole (NIZORAL) 2 % shampoo Apply 1 application topically 2 (two) times a week.     lamoTRIgine (LAMICTAL) 200 MG tablet 2 (two) times daily.      montelukast (SINGULAIR) 10 MG tablet Take 10 mg by mouth at bedtime.  1   neomycin-polymyxin b-dexamethasone (MAXITROL) 3.5-10000-0.1 OINT Place 1 application into both eyes once a week. Apply to eyelids     ondansetron (ZOFRAN-ODT) 8 MG disintegrating tablet Take 8 mg  by mouth every 8 (eight) hours as needed for nausea/vomiting.     Polyethylene Glycol 3350 (PEG 3350) POWD Take 51 g by mouth daily as needed (constipation). 3 scoops     QUEtiapine (SEROQUEL) 25 MG tablet Take 50 mg by mouth at bedtime.     rizatriptan (MAXALT-MLT) 10 MG disintegrating tablet Take 10 mg by mouth daily as needed for migraine.     rosuvastatin (CRESTOR) 10 MG tablet Take 10 mg by mouth at bedtime.     Semaglutide, 2 MG/DOSE, (OZEMPIC, 2 MG/DOSE,) 8 MG/3ML SOPN Inject 2 mg into the skin once a week. 9 mL 3   Wheat Dextrin (BENEFIBER DRINK MIX PO) Take 3 Scoops by mouth daily as needed (constipation).     No current facility-administered medications on file prior to visit.    Allergies  Allergen Reactions   Aptiom [Eslicarbazepine]     Causes seizures   Cedax [Ceftibuten]     Causes vomiting   Prednisone     Seizures   Vimpat [Lacosamide]     Causes seizures    Family History  Problem Relation Age of Onset   Diabetes Father     BP 140/84    Pulse 81    Ht 5\' 2"  (1.575 m)    Wt 161 lb 9.6 oz (73.3 kg)    SpO2 97%    BMI 29.56 kg/m    Review of Systems     Objective:   Physical Exam Pulses: dorsalis pedis intact bilat.   MSK: no deformity of the feet CV: no leg edema Skin:  no ulcer on the feet, but there are heavy calluses, and the skin is dry.  normal color and temp on the feet.   Neuro: sensation is intact to touch on the feet.   Ext: there is bilateral onychomycosis of the toenails.     Lab Results  Component Value Date   HGBA1C 9.2 (A) 07/15/2021      Assessment & Plan:  Insulin-requiring type 2 DM: uncontrolled, due to supply probs.    Patient Instructions  Please continue the same Trulicity and Levemir.   I have sent a prescription to your pharmacy, for the continuous glucose monitor.   check your blood sugar twice a day.  vary the time of day when you check, between before the 3 meals, and at bedtime.  also check if you have symptoms of your  blood sugar being too high or too low.  please keep a record of the readings and bring it to  your next appointment here (or you can bring the meter itself).  You can write it on any piece of paper.  please call us sooner if your blood sugar goes below 70, or if you have a lot of readings over 200.   Please come back for a follow-up appointment in 2 months.

## 2021-07-15 NOTE — Patient Instructions (Addendum)
Please continue the same Trulicity and Levemir.   I have sent a prescription to your pharmacy, for the continuous glucose monitor.   check your blood sugar twice a day.  vary the time of day when you check, between before the 3 meals, and at bedtime.  also check if you have symptoms of your blood sugar being too high or too low.  please keep a record of the readings and bring it to your next appointment here (or you can bring the meter itself).  You can write it on any piece of paper.  please call us sooner if your blood sugar goes below 70, or if you have a lot of readings over 200.   Please come back for a follow-up appointment in 2 months.
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
## 2021-09-23 ENCOUNTER — Ambulatory Visit (INDEPENDENT_AMBULATORY_CARE_PROVIDER_SITE_OTHER): Payer: No Typology Code available for payment source | Admitting: Endocrinology

## 2021-09-23 VITALS — BP 112/70 | HR 94 | Ht 62.0 in | Wt 159.8 lb

## 2021-09-23 DIAGNOSIS — E1143 Type 2 diabetes mellitus with diabetic autonomic (poly)neuropathy: Secondary | ICD-10-CM

## 2021-09-23 DIAGNOSIS — Z794 Long term (current) use of insulin: Secondary | ICD-10-CM

## 2021-09-23 LAB — POCT GLYCOSYLATED HEMOGLOBIN (HGB A1C): Hemoglobin A1C: 11.1 % — AB (ref 4.0–5.6)

## 2021-09-23 NOTE — Patient Instructions (Addendum)
Blood tests are requested for you today.  We'll let you know about the results.  ?If this is also high, we'll increase the Levemir.   ?check your blood sugar twice a day.  vary the time of day when you check, between before the 3 meals, and at bedtime.  also check if you have symptoms of your blood sugar being too high or too low.  please keep a record of the readings and bring it to your next appointment here (or you can bring the meter itself).  You can write it on any piece of paper.  please call us sooner if your blood sugar goes below 70, or if you have a lot of readings over 200.    ?You should have an endocrinology follow-up appointment in 2 months.   ?

## 2021-09-23 NOTE — Progress Notes (Signed)
? ?Subjective:  ? ? Patient ID: Emma Payne, female    DOB: 02/24/1961, 61 y.o.   MRN: [id number] ? ?HPI ?Pt returns for f/u of diabetes mellitus:  ?DM type: Insulin-requiring type 2. ?Dx'ed: 2002 ?Complications: PN and GP.   ?Therapy: insulin since early 2018, and Ozempic.   ?GDM: never (G0) ?DKA: never ?Severe hypoglycemia: never.  ?Pancreatitis: never ?Pancreatic imaging: never ?SDOH: pt says PTSD is compromising the care of her DM; she seldom checks cbg.  ?Other: she declined to continue multiple daily injections; she declines weight loss surgery; she declines insulin pump and continuous glucose monitor; she did not tolerate [place] or Invokana (yeast); Levemir was changed to glargine, due to pattern of cbg's, but ins declined to continue.   ?Interval history: She declines continuous glucose monitor.  she brings a record of her cbg's which I have reviewed today, but there are only 10 cbg's.  Cbg varies from 111-503. Pt says she started Ozempic just 2 weeks ago, and cbg's have not improved since then. She takes Levemir, 160 units qam (she seldom misses).   ?Past Medical History:  ?Diagnosis Date  ? Diabetes ([facility])   ? ? ?No past surgical history on file. ? ?Social History  ? ?Socioeconomic History  ? Marital status: Divorced  ?  Spouse name: Not on file  ? Number of children: Not on file  ? Years of education: Not on file  ? Highest education level: Not on file  ?Occupational History  ? Not on file  ?Tobacco Use  ? Smoking status: Never  ? Smokeless tobacco: Never  ?Substance and Sexual Activity  ? Alcohol use: No  ? Drug use: Not on file  ? Sexual activity: Not on file  ?Other Topics Concern  ? Not on file  ?Social History Narrative  ? Not on file  ? ?Social Determinants of Health  ? ?Financial Resource Strain: Not on file  ?Food Insecurity: Not on file  ?Transportation Needs: Not on file  ?Physical Activity: Not on file  ?Stress: Not on file  ?Social Connections: Not on file  ?Intimate Partner Violence: Not on  file  ? ? ?Current Outpatient Medications on File Prior to Visit  ?Medication Sig Dispense Refill  ? acetaZOLAMIDE (DIAMOX) 250 MG tablet Take 250 mg by mouth at bedtime.    ? albuterol (PROVENTIL HFA;VENTOLIN HFA) 108 (90 Base) MCG/ACT inhaler Inhale 2 puffs into the lungs every 6 (six) hours as needed for wheezing or shortness of breath.    ? Ascorbic Acid (VITAMIN C) 1000 MG tablet Take 1,000 mg by mouth daily.    ? aspirin 81 MG chewable tablet Chew 81 mg by mouth daily.    ? Atogepant (QULIPTA) 60 MG TABS Take 60 mg by mouth daily.    ? B-D UF III MINI PEN NEEDLES 31G X 5 MM MISC 1 DEVICE BY OTHER ROUTE SEE ADMIN INSTRUCTIONS. 1 NEEDLE 5 TIMES PER DAY 500 each 3  ? BD INTEGRA SYRINGE 25G X 1" 3 ML MISC     ? BD PEN NEEDLE NANO 2ND GEN 32G X 4 MM MISC USE 4 TIMES A DAY 400 each 3  ? clobetasol cream (TEMOVATE) 0.05 % Apply 1 application topically 2 (two) times daily.    ? clonazePAM (KLONOPIN) 1 MG tablet Take 0.5-1.5 mg by mouth See admin instructions. Taking 0.5 mg in the AM and 1.5 mg at bedtime    ? Continuous Blood Gluc Sensor (FREESTYLE LIBRE 2 SENSOR) MISC 1 Device by Does  not apply route every 14 (fourteen) days. 6 each 3  ? cyanocobalamin (,VITAMIN B-12,) 1000 MCG/ML injection Inject 1,000 mcg into the muscle every 30 (thirty) days.    ? Dextromethorphan-Quinidine 20-10 MG CAPS Take 1 capsule by mouth 2 (two) times daily.    ? dicyclomine (BENTYL) 10 MG capsule Take 10 mg by mouth 2 (two) times daily as needed for spasms.    ? FLUoxetine (PROZAC) 40 MG capsule Take 80 mg by mouth every morning.    ? insulin detemir (LEVEMIR FLEXPEN) 100 UNIT/ML FlexPen Inject 180 Units into the skin every morning. And pen needles 1/day 180 mL 3  ? irbesartan (AVAPRO) 150 MG tablet Take 150 mg by mouth daily.  0  ? ketoconazole (NIZORAL) 2 % cream Apply 1 application topically 2 (two) times daily.    ? ketoconazole (NIZORAL) 2 % shampoo Apply 1 application topically 2 (two) times a week.    ? lamoTRIgine (LAMICTAL) 200  MG tablet 2 (two) times daily.     ? montelukast (SINGULAIR) 10 MG tablet Take 10 mg by mouth at bedtime.  1  ? neomycin-polymyxin b-dexamethasone (MAXITROL) 3.5-10000-0.1 OINT Place 1 application into both eyes once a week. Apply to eyelids    ? ondansetron (ZOFRAN-ODT) 8 MG disintegrating tablet Take 8 mg by mouth every 8 (eight) hours as needed for nausea/vomiting.    ? Polyethylene Glycol 3350 (PEG 3350) POWD Take 51 g by mouth daily as needed (constipation). 3 scoops    ? QUEtiapine (SEROQUEL) 25 MG tablet Take 50 mg by mouth at bedtime.    ? rizatriptan (MAXALT-MLT) 10 MG disintegrating tablet Take 10 mg by mouth daily as needed for migraine.    ? rosuvastatin (CRESTOR) 10 MG tablet Take 10 mg by mouth at bedtime.    ? Semaglutide, 2 MG/DOSE, (OZEMPIC, 2 MG/DOSE,) 8 MG/3ML SOPN Inject 2 mg into the skin once a week. 9 mL 3  ? Wheat Dextrin (BENEFIBER DRINK MIX PO) Take 3 Scoops by mouth daily as needed (constipation).    ? ?No current facility-administered medications on file prior to visit.  ? ? ?Allergies  ?Allergen Reactions  ? Aptiom [Eslicarbazepine]   ?  Causes seizures  ? Cedax [Ceftibuten]   ?  Causes vomiting  ? Prednisone   ?  Seizures  ? Vimpat [Lacosamide]   ?  Causes seizures  ? ? ?Family History  ?Problem Relation Age of Onset  ? Diabetes Father   ? ? ?BP 112/70 (BP Location: Left Arm, Patient Position: Sitting, Cuff Size: Normal)   Pulse 94   Ht 5\' 2"  (1.575 m)   Wt 159 lb 12.8 oz (72.5 kg)   SpO2 100%   BMI 29.23 kg/m?  ? ? ?Review of Systems ?She denies hypoglycemia/N/HB.   ?   ?Objective:  ? Physical Exam ?VITAL SIGNS:  See vs page.   ?GENERAL: no distress.   ? ? ?A1c=11.1% ?   ?Assessment & Plan:  ?Insulin-requiring type 2 DM: uncontrolled.   ? ?Patient Instructions  ?Blood tests are requested for you today.  We'll let you know about the results.  ?If this is also high, we'll increase the Levemir.   ?check your blood sugar twice a day.  vary the time of day when you check, between  before the 3 meals, and at bedtime.  also check if you have symptoms of your blood sugar being too high or too low.  please keep a record of the readings and bring it to your  next appointment here (or you can bring the meter itself).  You can write it on any piece of paper.  please call us sooner if your blood sugar goes below 70, or if you have a lot of readings over 200.    ?You should have an endocrinology follow-up appointment in 2 months.   ? ? ?

## 2021-09-27 ENCOUNTER — Encounter: Payer: Self-pay | Admitting: Endocrinology

## 2021-09-27 LAB — FRUCTOSAMINE: Fructosamine: 417 umol/L — ABNORMAL HIGH (ref 205–285)

## 2021-11-24 ENCOUNTER — Ambulatory Visit: Payer: No Typology Code available for payment source | Admitting: Endocrinology

## 2021-11-24 ENCOUNTER — Encounter: Payer: Self-pay | Admitting: Endocrinology

## 2021-11-24 VITALS — BP 144/80 | HR 79 | Ht 62.0 in | Wt 154.0 lb

## 2021-11-24 DIAGNOSIS — E782 Mixed hyperlipidemia: Secondary | ICD-10-CM

## 2021-11-24 DIAGNOSIS — E1165 Type 2 diabetes mellitus with hyperglycemia: Secondary | ICD-10-CM | POA: Diagnosis not present

## 2021-11-24 DIAGNOSIS — E1142 Type 2 diabetes mellitus with diabetic polyneuropathy: Secondary | ICD-10-CM

## 2021-11-24 DIAGNOSIS — I10 Essential (primary) hypertension: Secondary | ICD-10-CM | POA: Diagnosis not present

## 2021-11-24 DIAGNOSIS — Z794 Long term (current) use of insulin: Secondary | ICD-10-CM

## 2021-11-24 MED ORDER — DEXCOM G7 SENSOR MISC: 1.0000 | 3 refills | Status: DC

## 2021-11-24 NOTE — Patient Instructions (Addendum)
Levemir 80 units twice daily  Meal time shots 10 units before meals and target after meal <200

## 2021-11-24 NOTE — Progress Notes (Unsigned)
Patient ID: Emma Payne, female   DOB: [birth year], 61 y.o.   MRN: [id number]          Dx'ed: 2002 Complications: PN and GP.   Therapy: insulin since early 2018, and Ozempic.   GDM: never (G0) DKA: never Severe hypoglycemia: never.  Pancreatitis: never Pancreatic imaging: never SDOH: pt says PTSD is compromising the care of her DM; she seldom checks cbg.  Other: she declined to continue multiple daily injections; she declines weight loss surgery; she declines insulin pump and continuous glucose monitor; she did not tolerate [place] or Invokana (yeast); Levemir was changed to glargine, due to pattern of cbg's, but ins declined to continue.   Interval history: She declines continuous glucose monitor.  she brings a record of her cbg's which I have reviewed today, but there are only 10 cbg's.  Cbg varies from 111-503   Reason for Appointment: Type II Diabetes follow-up   History of Present Illness   Diagnosis date:   Previous history:  Oral hypoglycemic drugs previously used are: Insulin was started in  A1c range in the last few years is:  Recent history:     Non-insulin hypoglycemic drugs:      Insulin regimen:            Side effects from medications: None  Current self management, blood sugar patterns and problems identified:  A1c is  Exercise: Diet management:      Monitors blood glucose: Once a day.    Glucometer: One Touch.           Blood Glucose readings from meter download:   PRE-MEAL Fasting Lunch Dinner Bedtime Overall  Glucose range: 77   500   Mean/median:        POST-MEAL PC Breakfast PC Lunch PC Dinner  Glucose range:     Mean/median:        Hypoglycemia:  none                        Dietician visit: Most recent:      Weight control:  Wt Readings from Last 3 Encounters:  11/24/21 154 lb (69.9 kg)  09/23/21 159 lb 12.8 oz (72.5 kg)  07/15/21 161 lb 9.6 oz (73.3 kg)            Diabetes labs:  Lab Results  Component Value Date    HGBA1C 11.1 (A) 09/23/2021   HGBA1C 9.2 (A) 07/15/2021   HGBA1C 10.2 (A) 05/04/2021   Lab Results  Component Value Date   CREATININE 0.85 03/01/2021     Allergies as of 11/24/2021       Reactions   Aptiom [eslicarbazepine]    Causes seizures   Cedax [ceftibuten]    Causes vomiting   Prednisone    Seizures   Vimpat [lacosamide]    Causes seizures        Medication List        Accurate as of November 24, 2021  2:12 PM. If you have any questions, ask your nurse or doctor.          acetaZOLAMIDE 250 MG tablet Commonly known as: DIAMOX Take 250 mg by mouth at bedtime.   albuterol 108 (90 Base) MCG/ACT inhaler Commonly known as: VENTOLIN HFA Inhale 2 puffs into the lungs every 6 (six) hours as needed for wheezing or shortness of breath.   aspirin 81 MG chewable tablet Chew 81 mg by mouth daily.   BD Integra Syringe 25G X 1" 3  ML Misc Generic drug: SYRINGE-NEEDLE (DISP) 3 ML   BD Pen Needle Nano 2nd Gen 32G X 4 MM Misc Generic drug: Insulin Pen Needle USE 4 TIMES A DAY   B-D UF III MINI PEN NEEDLES 31G X 5 MM Misc Generic drug: Insulin Pen Needle 1 DEVICE BY OTHER ROUTE SEE ADMIN INSTRUCTIONS. 1 NEEDLE 5 TIMES PER DAY   BENEFIBER DRINK MIX PO Take 3 Scoops by mouth daily as needed (constipation).   clobetasol cream 0.05 % Commonly known as: TEMOVATE Apply 1 application topically 2 (two) times daily.   clonazePAM 1 MG tablet Commonly known as: KLONOPIN Take 0.5-1.5 mg by mouth See admin instructions. Taking 0.5 mg in the AM and 1.5 mg at bedtime   cyanocobalamin 1000 MCG/ML injection Commonly known as: (VITAMIN B-12) Inject 1,000 mcg into the muscle every 30 (thirty) days.   Dextromethorphan-quiNIDine 20-10 MG capsule Commonly known as: NUEDEXTA Take 1 capsule by mouth 2 (two) times daily.   dicyclomine 10 MG capsule Commonly known as: BENTYL Take 10 mg by mouth 2 (two) times daily as needed for spasms.   FLUoxetine 40 MG capsule Commonly known as:  PROZAC Take 80 mg by mouth every morning.   FreeStyle Libre 2 Sensor Misc 1 Device by Does not apply route every 14 (fourteen) days.   insulin detemir 100 UNIT/ML FlexPen Commonly known as: Levemir FlexPen Inject 180 Units into the skin every morning. And pen needles 1/day What changed: how much to take   irbesartan 150 MG tablet Commonly known as: AVAPRO Take 150 mg by mouth daily.   ketoconazole 2 % shampoo Commonly known as: NIZORAL Apply 1 application topically 2 (two) times a week.   ketoconazole 2 % cream Commonly known as: NIZORAL Apply 1 application topically 2 (two) times daily.   lamoTRIgine 200 MG tablet Commonly known as: LAMICTAL 2 (two) times daily.   montelukast 10 MG tablet Commonly known as: SINGULAIR Take 10 mg by mouth at bedtime.   neomycin-polymyxin b-dexamethasone 3.5-10000-0.1 Oint Commonly known as: MAXITROL Place 1 application into both eyes once a week. Apply to eyelids   ondansetron 8 MG disintegrating tablet Commonly known as: ZOFRAN-ODT Take 8 mg by mouth every 8 (eight) hours as needed for nausea/vomiting.   Ozempic (2 MG/DOSE) 8 MG/3ML Sopn Generic drug: Semaglutide (2 MG/DOSE) Inject 2 mg into the skin once a week.   PEG 3350 17 GM/SCOOP Powd Take 51 g by mouth daily as needed (constipation). 3 scoops   QUEtiapine 25 MG tablet Commonly known as: SEROQUEL Take 50 mg by mouth at bedtime.   Qulipta 60 MG Tabs Generic drug: Atogepant Take 60 mg by mouth daily.   rizatriptan 10 MG disintegrating tablet Commonly known as: MAXALT-MLT Take 10 mg by mouth daily as needed for migraine.   rosuvastatin 10 MG tablet Commonly known as: CRESTOR Take 10 mg by mouth at bedtime.   vitamin C 1000 MG tablet Take 1,000 mg by mouth daily.        Allergies:  Allergies  Allergen Reactions   Aptiom [Eslicarbazepine]     Causes seizures   Cedax [Ceftibuten]     Causes vomiting   Prednisone     Seizures   Vimpat [Lacosamide]      Causes seizures    Past Medical History:  Diagnosis Date   Diabetes (HCC)     No past surgical history on file.  Family History  Problem Relation Age of Onset   Diabetes Father     Social  History:  reports that she has never smoked. She has never used smokeless tobacco. She reports that she does not drink alcohol. No history on file for drug use.  Review of Systems:  Last diabetic eye exam date  Last foot exam date:  Symptoms of neuropathy:[place]  Hypertension:   Treatment includes  BP Readings from Last 3 Encounters:  11/24/21 (!) 144/80  09/23/21 112/70  07/15/21 140/84    Lipids:     No results found for: "CHOL" No results found for: "HDL" No results found for: "LDLCALC" No results found for: "TRIG" No results found for: "CHOLHDL" No results found for: "LDLDIRECT"   Examination:   BP (!) 144/80   Pulse 79   Ht 5\' 2"  (1.575 m)   Wt 154 lb (69.9 kg)   SpO2 98%   BMI 28.17 kg/m   Body mass index is 28.17 kg/m.    ASSESSMENT/ PLAN:    Diabetes type 2:   Current regimen:  A1c is  Blood glucose control  There are no Patient Instructions on file for this visit.   Elayne Snare 11/24/2021, 2:12 PM

## 2021-11-27 MED ORDER — NOVOLOG FLEXPEN 100 UNIT/ML ~~LOC~~ SOPN: PEN_INJECTOR | SUBCUTANEOUS | 1 refills | Status: DC

## 2021-11-29 ENCOUNTER — Other Ambulatory Visit (HOSPITAL_COMMUNITY): Payer: Self-pay

## 2021-11-29 ENCOUNTER — Telehealth: Payer: Self-pay | Admitting: Pharmacy Technician

## 2021-12-01 ENCOUNTER — Other Ambulatory Visit (HOSPITAL_COMMUNITY): Payer: Self-pay

## 2021-12-08 ENCOUNTER — Telehealth: Payer: Self-pay | Admitting: Dietician

## 2021-12-08 NOTE — Telephone Encounter (Signed)
Called patient and arranged an appointment for patient to see Bonita Quin for [facility] and diabetes education on July 19th at 3:00.  Oran Rein, RD, LDN, CDCES

## 2021-12-21 ENCOUNTER — Encounter: Payer: No Typology Code available for payment source | Admitting: Nutrition

## 2022-01-04 ENCOUNTER — Encounter: Payer: No Typology Code available for payment source | Attending: Family Medicine | Admitting: Nutrition

## 2022-01-04 DIAGNOSIS — E1165 Type 2 diabetes mellitus with hyperglycemia: Secondary | ICD-10-CM | POA: Diagnosis present

## 2022-01-10 NOTE — Progress Notes (Signed)
Patient was trained on the use of the Dexcom G7 CGM.  We discussed the difference between CGM and blood glucose monitoring, when to test with fingerstick.  She reported good understanding of this.  The dexcom app was not able to be downloaded to her phone, so she is using a reader.  This was set with date/time/transmitter id by the patient.   She also reports taking levemir 80u  bid without difficulty, and the Novolog 10u ac meals.  She does not like all of the injections, but reports doing this.  She was shown the [employer] and given a brochure to discuss this option with dr. Lucianne Muss at his next visit.  We discussed the need for Novolog and how to determine if she is taking the right dose for her meals-by looking at the CGM to see if the 2hr. Pc reading is less than 180, and the blood sugar returns to less than 120 before the next meal.   Discussed the need for balanced meal, and what this means as well as what to do when eating more/less food by adjusting the Novolog dose by 1-2u .  Written instructions given for this. She had no final questions

## 2022-01-10 NOTE — Patient Instructions (Addendum)
Change Dexcom sensor every 10 days.  Make sure to have protein, carbohydrate and small amount of fat at each meal Increase dose of Novolog by 1-2 units when eating a larger than normal meal Decrease Novolog by 1-2u when eating a smaller meal.

## 2022-02-08 NOTE — Progress Notes (Unsigned)
Patient ID: Ameirah Khatoon, female   DOB: Jan 03, 1961, 61 y.o.   MRN: [id number]           Reason for Appointment: Type II Diabetes follow-up   History of Present Illness   Diagnosis date: 2002  Previous history:  Oral hypoglycemic drugs previously used: Farxiga, Trulicity Insulin was started in 2018  A1c range in the last few years is: 7.5-11.5  Recent history:     Non-insulin hypoglycemic drugs: Ozempic 2 mg weekly     Insulin regimen: Levemir insulin 80 units twice a day, NovoLog 10 units before meals          Side effects from medications: Yeast infections from [place]  Current self management, blood sugar patterns and problems identified:  A1c is now 7.5 compared to 11.1 as of 09/2021 She was started on NOVOLOG on her last visit to help with postprandial control She has started taking this fairly regularly and appears to have minimal postprandial hyperglycemia compared to before when blood sugars were potentially as high as 500 However she is not adjusting the NovoLog based on portions or type of meal she is eating  Her Levemir was continued because of large supply at home but splitting to twice a day injection, also total daily dose was reduced by 20 units With this her blood sugars are appearing more evenly controlled Premeal throughout the day and night  Ozempic was continued She has been able to start the Dexcom sensor and has no difficulty using it now  Analysis of her Dexcom G7 download for the last 2 weeks shows the following  Her blood sugars are somewhat variable from day-to-day with tendency to high readings late morning and lower readings before dinner and overnight HYPERGLYCEMIC spikes are seen periodically after different meals but also occasionally overnight or for several hours at a time OVERNIGHT blood sugars are somewhat variable with both hyperglycemia and occasionally mild hypoglycemia with lower readings around 1-3 AM in the first week of her  data and only once recently Fasting readings around 7 AM average in the 160s As above postprandial readings are not consistent with periodic hyperglycemia after all the meals although less after dinner in the last week Premeal blood sugars are somewhat variable and appear to be higher in the second week overnight and before dinner Average highest blood sugar is 205 midday and LOWEST blood sugar overall 137 around 6 PM  CGM use % of time 100  2-week average/GV 165, SD 52  Time in range       62%  % Time Above 180 29  % Time above 250 6  % Time Below 70 2                            CDE visit: Most recent: 2018 Weight control:  Wt Readings from Last 3 Encounters:  02/09/22 152 lb 6.4 oz (69.1 kg)  11/24/21 154 lb (69.9 kg)  09/23/21 159 lb 12.8 oz (72.5 kg)            Diabetes labs:  Lab Results  Component Value Date   HGBA1C 7.5 (A) 02/09/2022   HGBA1C 11.1 (A) 09/23/2021   HGBA1C 9.2 (A) 07/15/2021   Lab Results  Component Value Date   MICROALBUR 2.0 02/09/2022   LDLCALC 114 (H) 02/09/2022   CREATININE 1.02 02/09/2022     Allergies as of 02/09/2022       Reactions   Aptiom [  eslicarbazepine]    Causes seizures   Cedax [ceftibuten]    Causes vomiting   Prednisone    Seizures   Vimpat [lacosamide]    Causes seizures        Medication List        Accurate as of February 09, 2022 11:59 PM. If you have any questions, ask your nurse or doctor.          acetaZOLAMIDE 250 MG tablet Commonly known as: DIAMOX Take 250 mg by mouth at bedtime.   albuterol 108 (90 Base) MCG/ACT inhaler Commonly known as: VENTOLIN HFA Inhale 2 puffs into the lungs every 6 (six) hours as needed for wheezing or shortness of breath.   aspirin 81 MG chewable tablet Chew 81 mg by mouth daily.   BD Integra Syringe 25G X 1" 3 ML Misc Generic drug: SYRINGE-NEEDLE (DISP) 3 ML   BD Pen Needle Nano 2nd Gen 32G X 4 MM Misc Generic drug: Insulin Pen Needle USE 4 TIMES A DAY   B-D  UF III MINI PEN NEEDLES 31G X 5 MM Misc Generic drug: Insulin Pen Needle 1 DEVICE BY OTHER ROUTE SEE ADMIN INSTRUCTIONS. 1 NEEDLE 5 TIMES PER DAY   BENEFIBER DRINK MIX PO Take 3 Scoops by mouth daily as needed (constipation).   clobetasol cream 0.05 % Commonly known as: TEMOVATE Apply 1 application topically 2 (two) times daily.   clonazePAM 1 MG tablet Commonly known as: KLONOPIN Take 0.5-1.5 mg by mouth See admin instructions. Taking 0.5 mg in the AM and 1.5 mg at bedtime   cyanocobalamin 1000 MCG/ML injection Commonly known as: VITAMIN B12 Inject 1,000 mcg into the muscle every 30 (thirty) days.   Dexcom G7 Sensor Misc 1 Device by Does not apply route as directed. Change sensor every 10 days   Dextromethorphan-quiNIDine 20-10 MG capsule Commonly known as: NUEDEXTA Take 1 capsule by mouth 2 (two) times daily.   dicyclomine 10 MG capsule Commonly known as: BENTYL Take 10 mg by mouth 2 (two) times daily as needed for spasms.   FLUoxetine 40 MG capsule Commonly known as: PROZAC Take 80 mg by mouth every morning.   insulin detemir 100 UNIT/ML FlexPen Commonly known as: Levemir FlexPen Inject 180 Units into the skin every morning. And pen needles 1/day What changed:  how much to take when to take this   irbesartan 150 MG tablet Commonly known as: AVAPRO Take 150 mg by mouth daily.   ketoconazole 2 % shampoo Commonly known as: NIZORAL Apply 1 application topically 2 (two) times a week.   ketoconazole 2 % cream Commonly known as: NIZORAL Apply 1 application topically 2 (two) times daily.   lamoTRIgine 200 MG tablet Commonly known as: LAMICTAL 2 (two) times daily.   montelukast 10 MG tablet Commonly known as: SINGULAIR Take 10 mg by mouth at bedtime.   neomycin-polymyxin b-dexamethasone 3.5-10000-0.1 Oint Commonly known as: MAXITROL Place 1 application into both eyes once a week. Apply to eyelids   NovoLOG FlexPen 100 UNIT/ML FlexPen Generic drug: insulin  aspart Take before each meal, starting with 10 units and increasing dose as directed, maximum 30 units at each meal   ondansetron 8 MG disintegrating tablet Commonly known as: ZOFRAN-ODT Take 8 mg by mouth every 8 (eight) hours as needed for nausea/vomiting.   Ozempic (2 MG/DOSE) 8 MG/3ML Sopn Generic drug: Semaglutide (2 MG/DOSE) Inject 2 mg into the skin once a week.   PEG 3350 17 GM/SCOOP Powd Take 51 g by mouth daily as  needed (constipation). 3 scoops   QUEtiapine 25 MG tablet Commonly known as: SEROQUEL Take 50 mg by mouth at bedtime.   Qulipta 60 MG Tabs Generic drug: Atogepant Take 60 mg by mouth daily.   rizatriptan 10 MG disintegrating tablet Commonly known as: MAXALT-MLT Take 10 mg by mouth daily as needed for migraine.   rosuvastatin 10 MG tablet Commonly known as: CRESTOR Take 10 mg by mouth at bedtime.   vitamin C 1000 MG tablet Take 1,000 mg by mouth daily.        Allergies:  Allergies  Allergen Reactions   Aptiom [Eslicarbazepine]     Causes seizures   Cedax [Ceftibuten]     Causes vomiting   Prednisone     Seizures   Vimpat [Lacosamide]     Causes seizures    Past Medical History:  Diagnosis Date   Diabetes ([place])     No past surgical history on file.  Family History  Problem Relation Age of Onset   Diabetes Father     Social History:  reports that she has never smoked. She has never used smokeless tobacco. She reports that she does not drink alcohol. No history on file for drug use.  Review of Systems:  Last diabetic eye exam date 04/2020  Last foot exam date: 02/25/2021  Most Recent microalbumin level 9/23  Symptoms of neuropathy: She has some tingling in her feet  Hypertension:   Treatment includes irbesartan 150 mg daily  BP Readings from Last 3 Encounters:  02/09/22 132/86  11/24/21 (!) 144/80  09/23/21 112/70    Lipids:  Treated with Crestor 10 mg daily    Lab Results  Component Value Date   CHOL 201 (H)  02/09/2022   Lab Results  Component Value Date   HDL 51 02/09/2022   Lab Results  Component Value Date   LDLCALC 114 (H) 02/09/2022   Lab Results  Component Value Date   TRIG 255 (H) 02/09/2022   Lab Results  Component Value Date   CHOLHDL 3.9 02/09/2022   No results found for: "LDLDIRECT"  No history of thyroid disease  Lab Results  Component Value Date   TSH 1.60 03/01/2021   TSH 2.17 01/17/2019   FREET4 1.1 03/01/2021   She has history of PTSD and mild difficulties with memory    Examination:   BP 132/86   Pulse 90   Ht 5\' 2"  (1.575 m)   Wt 152 lb 6.4 oz (69.1 kg)   SpO2 99%   BMI 27.87 kg/m   Body mass index is 27.87 kg/m.    ASSESSMENT/ PLAN:    Diabetes type 2 insulin requiring with BMI under 30:   Current regimen: Ozempic and 180 units of Levemir insulin once a day  A1c is 7.5 and previously has been as high as 11.1 and consistently high  Blood glucose control is significantly improved with adding mealtime insulin and also splitting Levemir to twice a day With her having the CGM she is much better aware of what is making her sugar go up She does need to learn how to adjust her mealtime dose based on carbohydrates and how much she is eating Although has had initial diabetes education this was only basic and partly related to starting the sensor Currently still requiring large amounts of basal insulin with overnight blood sugars averaging in the 150-170 range   Recommendations:  Today discussed  adjustment of her mealtime dose based on her meal size and carbohydrate intake She  needs to go up to as much as 20 units for eating out or larger meals or desserts but otherwise generally takes 10 to 15 units for average meals, likely needs somewhat larger dose at breakfast and lunch based on her above blood sugar patterns Follow-up consultation with diabetes educator would be desirable For now we will continue the same dose of Levemir but since she is going  to switch to [place] she can start with 140 units once a day After a week or so she can adjust the dose up or down 10 units based on fasting blood sugar patterns and given her a flowsheet to help adjust this with detailed instructions Continue Ozempic Labs to be checked including lipids and microalbumin  Patient Instructions  Tresiba 140 daily in am  Novolog 10-20 at meals    Reather Littler 02/12/2022, 9:11 PM   Addendum: Labs show LDL 114 and chemistry normal, recommend increasing Crestor to 20 mg, she will discuss with PCP

## 2022-02-09 ENCOUNTER — Ambulatory Visit (INDEPENDENT_AMBULATORY_CARE_PROVIDER_SITE_OTHER): Payer: No Typology Code available for payment source | Admitting: Endocrinology

## 2022-02-09 ENCOUNTER — Encounter: Payer: Self-pay | Admitting: Endocrinology

## 2022-02-09 VITALS — BP 132/86 | HR 90 | Ht 62.0 in | Wt 152.4 lb

## 2022-02-09 DIAGNOSIS — E782 Mixed hyperlipidemia: Secondary | ICD-10-CM

## 2022-02-09 DIAGNOSIS — Z794 Long term (current) use of insulin: Secondary | ICD-10-CM

## 2022-02-09 DIAGNOSIS — E1165 Type 2 diabetes mellitus with hyperglycemia: Secondary | ICD-10-CM

## 2022-02-09 DIAGNOSIS — E1142 Type 2 diabetes mellitus with diabetic polyneuropathy: Secondary | ICD-10-CM | POA: Diagnosis not present

## 2022-02-09 LAB — POCT GLYCOSYLATED HEMOGLOBIN (HGB A1C): Hemoglobin A1C: 7.5 % — AB (ref 4.0–5.6)

## 2022-02-09 NOTE — Addendum Note (Signed)
Addended by: Reather Littler on: 02/09/2022 02:46 PM   Modules accepted: Orders

## 2022-02-09 NOTE — Patient Instructions (Signed)
Tresiba 140 daily in am  Novolog 10-20 at meals

## 2022-02-10 LAB — LIPID PANEL
Cholesterol: 201 mg/dL — ABNORMAL HIGH (ref <200)
HDL: 51 mg/dL (ref >=50)
LDL Cholesterol (Calc): 114 mg/dL (calc) — ABNORMAL HIGH
Non-HDL Cholesterol (Calc): 150 mg/dL (calc) — ABNORMAL HIGH (ref <130)
Total CHOL/HDL Ratio: 3.9 (calc) (ref <5.0)
Triglycerides: 255 mg/dL — ABNORMAL HIGH (ref <150)

## 2022-02-10 LAB — COMPREHENSIVE METABOLIC PANEL
AG Ratio: 1.7 (calc) (ref 1.0–2.5)
ALT: 18 U/L (ref 6–29)
AST: 16 U/L (ref 10–35)
Albumin: 4.5 g/dL (ref 3.6–5.1)
Alkaline phosphatase (APISO): 101 U/L (ref 37–153)
BUN: 17 mg/dL (ref 7–25)
CO2: 21 mmol/L (ref 20–32)
Calcium: 9.6 mg/dL (ref 8.6–10.4)
Chloride: 109 mmol/L (ref 98–110)
Creat: 1.02 mg/dL (ref 0.50–1.05)
Globulin: 2.7 g/dL (calc) (ref 1.9–3.7)
Glucose, Bld: 56 mg/dL — ABNORMAL LOW (ref 65–99)
Potassium: 3.7 mmol/L (ref 3.5–5.3)
Sodium: 142 mmol/L (ref 135–146)
Total Bilirubin: 0.5 mg/dL (ref 0.2–1.2)
Total Protein: 7.2 g/dL (ref 6.1–8.1)

## 2022-02-10 LAB — MICROALBUMIN / CREATININE URINE RATIO
Creatinine, Urine: 162 mg/dL (ref 20–275)
Microalb Creat Ratio: 12 mcg/mg creat (ref <30)
Microalb, Ur: 2 mg/dL

## 2022-02-15 ENCOUNTER — Encounter: Payer: Self-pay | Admitting: Endocrinology

## 2022-03-08 ENCOUNTER — Other Ambulatory Visit: Payer: Self-pay

## 2022-03-08 DIAGNOSIS — E1165 Type 2 diabetes mellitus with hyperglycemia: Secondary | ICD-10-CM

## 2022-03-08 MED ORDER — BD PEN NEEDLE MINI U/F 31G X 5 MM MISC: 3 refills | Status: AC

## 2022-03-08 MED ORDER — TRESIBA FLEXTOUCH 100 UNIT/ML ~~LOC~~ SOPN: PEN_INJECTOR | SUBCUTANEOUS | 2 refills | Status: DC

## 2022-03-20 ENCOUNTER — Other Ambulatory Visit: Payer: Self-pay | Admitting: Endocrinology

## 2022-05-24 ENCOUNTER — Encounter: Payer: Self-pay | Admitting: Endocrinology

## 2022-05-24 ENCOUNTER — Ambulatory Visit (INDEPENDENT_AMBULATORY_CARE_PROVIDER_SITE_OTHER): Payer: No Typology Code available for payment source | Admitting: Endocrinology

## 2022-05-24 VITALS — BP 152/84 | HR 70 | Ht 62.0 in | Wt 154.0 lb

## 2022-05-24 DIAGNOSIS — Z794 Long term (current) use of insulin: Secondary | ICD-10-CM | POA: Diagnosis not present

## 2022-05-24 DIAGNOSIS — E1165 Type 2 diabetes mellitus with hyperglycemia: Secondary | ICD-10-CM

## 2022-05-24 DIAGNOSIS — E1065 Type 1 diabetes mellitus with hyperglycemia: Secondary | ICD-10-CM

## 2022-05-24 MED ORDER — ACCU-CHEK SOFTCLIX LANCETS MISC: 12 refills | Status: AC

## 2022-05-24 MED ORDER — GVOKE HYPOPEN 2-PACK 0.5 MG/0.1ML ~~LOC~~ SOAJ: SUBCUTANEOUS | 1 refills | Status: AC

## 2022-05-24 MED ORDER — ACCU-CHEK GUIDE VI STRP: ORAL_STRIP | 12 refills | Status: DC

## 2022-05-24 NOTE — Progress Notes (Signed)
Patient ID: Emma Payne, female   DOB: 12-15-1960, 61 y.o.   MRN: [id number]           Reason for Appointment: Type II Diabetes follow-up   History of Present Illness   Diagnosis date: 2002  Previous history:  Oral hypoglycemic drugs previously used: Farxiga, Trulicity Insulin was started in 2018  A1c range in the last few years is: 7.5-11.5  Recent history:     Non-insulin hypoglycemic drugs: None     Insulin regimen: NovoLog 10-20 units before meals Tresiba 140 before dinner        Side effects from medications: Yeast infections from [place]  Current self management, blood sugar patterns and problems identified:  A1c is 13.5 as of 11/23 She apparently had difficulties getting [place] refilled in October and without this her blood sugar went up to 668 and she was in the ER symptomatic with hyperglycemia, did not let us know about this For the last 4 weeks has not used her Dexcom except starting this morning since she had difficulties keeping it consistently on her arm and did not have any supplies until today She is checking blood sugar with a generic monitor and this showed very inconsistent readings Also not checking after dinner Recently has taken her Evaristo Bury regularly She was started on NOVOLOG in 6/23 Since last visit has not been taking Ozempic, does not think her blood sugars are different with stopping this She generally takes 10 units unless eating a high carbohydrate meal like pasta and then she will take 20 units However difficult to assess her readings after meals with limited data available She has only occasional hypoglycemia which is mild in the 60s and 70s but today in the office about 4-5 hours after her morning insulin she had prolonged hypoglycemia with blood sugars in the low 50s on her Dexcom but still asymptomatic. Her sister is helping her with diabetes management but home does not know about glucagon  Blood sugar readings by meter  review   PRE-MEAL Fasting Lunch Dinner Bedtime Overall  Glucose range: 106-254 213-379 96-189 227   Mean/median:     130   POST-MEAL PC Breakfast PC Lunch PC Dinner  Glucose range:   ?  Mean/median:        PREVIOUS analysis of her Dexcom G7 download:  Her blood sugars are somewhat variable from day-to-day with tendency to high readings late morning and lower readings before dinner and overnight HYPERGLYCEMIC spikes are seen periodically after different meals but also occasionally overnight or for several hours at a time OVERNIGHT blood sugars are somewhat variable with both hyperglycemia and occasionally mild hypoglycemia with lower readings around 1-3 AM in the first week of her data and only once recently Fasting readings around 7 AM average in the 160s As above postprandial readings are not consistent with periodic hyperglycemia after all the meals although less after dinner in the last week Premeal blood sugars are somewhat variable and appear to be higher in the second week overnight and before dinner Average highest blood sugar is 205 midday and LOWEST blood sugar overall 137 around 6 PM   CGM use % of time 100  2-week average/GV 165, SD 52  Time in range       62%  % Time Above 180 29  % Time above 250 6  % Time Below 70 2  CDE visit: Most recent: 2018 Weight control:  Wt Readings from Last 3 Encounters:  05/24/22 154 lb (69.9 kg)  02/09/22 152 lb 6.4 oz (69.1 kg)  11/24/21 154 lb (69.9 kg)            Diabetes labs:  Lab Results  Component Value Date   HGBA1C 7.5 (A) 02/09/2022   HGBA1C 11.1 (A) 09/23/2021   HGBA1C 9.2 (A) 07/15/2021   Lab Results  Component Value Date   MICROALBUR 2.0 02/09/2022   LDLCALC 114 (H) 02/09/2022   CREATININE 1.02 02/09/2022     Allergies as of 05/24/2022       Reactions   Aptiom [eslicarbazepine]    Causes seizures   Cedax [ceftibuten]    Causes vomiting   Prednisone    Seizures   Vimpat  [lacosamide]    Causes seizures        Medication List        Accurate as of May 24, 2022  4:54 PM. If you have any questions, ask your nurse or doctor.          STOP taking these medications    insulin detemir 100 UNIT/ML FlexPen Commonly known as: Levemir FlexPen Stopped by: Emma LittlerAjay Brittani Purdum, Emma Payne       TAKE these medications    Accu-Chek Guide test strip Generic drug: glucose blood Use to check blood 3 times a day Started by: Emma LittlerAjay Maverick Dieudonne, Emma Payne   Accu-Chek Softclix Lancets lancets Use to check blood sugar 3 times a day Started by: Emma LittlerAjay Urania Pearlman, Emma Payne   acetaZOLAMIDE 250 MG tablet Commonly known as: DIAMOX Take 250 mg by mouth at bedtime. 1 tablet in morning and 1 at night   albuterol 108 (90 Base) MCG/ACT inhaler Commonly known as: VENTOLIN HFA Inhale 2 puffs into the lungs every 6 (six) hours as needed for wheezing or shortness of breath.   aspirin 81 MG chewable tablet Chew 81 mg by mouth daily.   BD Integra Syringe 25G X 1" 3 ML Misc Generic drug: SYRINGE-NEEDLE (DISP) 3 ML   BD Pen Needle Nano 2nd Gen 32G X 4 MM Misc Generic drug: Insulin Pen Needle USE 4 TIMES A DAY   B-D UF III MINI PEN NEEDLES 31G X 5 MM Misc Generic drug: Insulin Pen Needle 1 DEVICE BY OTHER ROUTE SEE ADMIN INSTRUCTIONS. 1 NEEDLE 5 TIMES PER DAY   clobetasol cream 0.05 % Commonly known as: TEMOVATE Apply 1 application topically 2 (two) times daily.   clonazePAM 1 MG tablet Commonly known as: KLONOPIN Take 0.5-1.5 mg by mouth See admin instructions. Taking 0.5 mg in the AM, 0.5mg  at3pm and 1.5 mg at bedtime   cyanocobalamin 1000 MCG/ML injection Commonly known as: VITAMIN B12 Inject 1,000 mcg into the muscle every 30 (thirty) days.   Dexcom G7 Sensor Misc 1 Device by Does not apply route as directed. Change sensor every 10 days   Dextromethorphan-quiNIDine 20-10 MG capsule Commonly known as: NUEDEXTA Take 1 capsule by mouth 2 (two) times daily.   dicyclomine 10 MG  capsule Commonly known as: BENTYL Take 10 mg by mouth 2 (two) times daily as needed for spasms.   FLUoxetine 40 MG capsule Commonly known as: PROZAC Take 80 mg by mouth every morning.   Gvoke HypoPen 2-Pack 0.5 MG/0.1ML Soaj Generic drug: Glucagon Inject for severe hypoglycemia Started by: Emma LittlerAjay Tanisha Lutes, Emma Payne   irbesartan 150 MG tablet Commonly known as: AVAPRO Take 150 mg by mouth daily.   ketoconazole 2 % shampoo Commonly known  as: NIZORAL Apply 1 application topically 2 (two) times a week.   ketoconazole 2 % cream Commonly known as: NIZORAL Apply 1 application topically 2 (two) times daily.   lamoTRIgine 200 MG tablet Commonly known as: LAMICTAL 2 (two) times daily.   montelukast 10 MG tablet Commonly known as: SINGULAIR Take 10 mg by mouth at bedtime.   neomycin-polymyxin b-dexamethasone 3.5-10000-0.1 Oint Commonly known as: MAXITROL Place 1 application into both eyes once a week. Apply to eyelids   NovoLOG FlexPen 100 UNIT/ML FlexPen Generic drug: insulin aspart TAKE BEFORE EACH MEAL, STARTING WITH 10 UNITS AND INCREASING DOSE AS DIRECTED, MAXIMUM 30 UNITS AT [facility] MEAL   ondansetron 8 MG disintegrating tablet Commonly known as: ZOFRAN-ODT Take 8 mg by mouth every 8 (eight) hours as needed for nausea/vomiting.   Ozempic (2 MG/DOSE) 8 MG/3ML Sopn Generic drug: Semaglutide (2 MG/DOSE) Inject 2 mg into the skin once a week.   PEG 3350 17 GM/SCOOP Powd Take 51 g by mouth daily as needed (constipation). 3 scoops   QUEtiapine 25 MG tablet Commonly known as: SEROQUEL Take 50 mg by mouth at bedtime.   Qulipta 60 MG Tabs Generic drug: Atogepant Take 60 mg by mouth daily.   rizatriptan 10 MG disintegrating tablet Commonly known as: MAXALT-MLT Take 10 mg by mouth daily as needed for migraine.   rosuvastatin 10 MG tablet Commonly known as: CRESTOR Take 10 mg by mouth at bedtime.   Evaristo Bury FlexTouch 100 UNIT/ML FlexTouch Pen Generic drug: insulin  degludec Inject 140 units daily   vitamin C 1000 MG tablet Take 1,000 mg by mouth daily.        Allergies:  Allergies  Allergen Reactions   Aptiom [Eslicarbazepine]     Causes seizures   Cedax [Ceftibuten]     Causes vomiting   Prednisone     Seizures   Vimpat [Lacosamide]     Causes seizures    Past Medical History:  Diagnosis Date   Diabetes (HCC)     No past surgical history on file.  Family History  Problem Relation Age of Onset   Diabetes Father     Social History:  reports that she has never smoked. She has never used smokeless tobacco. She reports that she does not drink alcohol. No history on file for drug use.  Review of Systems:  Last diabetic eye exam date 04/2020  Last foot exam date: 02/25/2021  Most Recent microalbumin level 9/23  Symptoms of neuropathy: She has some tingling in her feet  Hypertension:   Treatment includes irbesartan 150 mg daily  BP Readings from Last 3 Encounters:  05/24/22 (!) 152/84  02/09/22 132/86  11/24/21 (!) 144/80    Lipids:  Treated with Crestor 10 mg daily by PCP    Lab Results  Component Value Date   CHOL 201 (H) 02/09/2022   Lab Results  Component Value Date   HDL 51 02/09/2022   Lab Results  Component Value Date   LDLCALC 114 (H) 02/09/2022   Lab Results  Component Value Date   TRIG 255 (H) 02/09/2022   Lab Results  Component Value Date   CHOLHDL 3.9 02/09/2022   No results found for: "LDLDIRECT"  No history of thyroid disease  Lab Results  Component Value Date   TSH 1.60 03/01/2021   TSH 2.17 01/17/2019   FREET4 1.1 03/01/2021   She has history of PTSD and mild difficulties with memory    Examination:   BP (!) 152/84   Pulse  70   Ht 5\' 2"  (1.575 m)   Wt 154 lb (69.9 kg)   SpO2 98%   BMI 28.17 kg/m   Body mass index is 28.17 kg/m.    ASSESSMENT/ PLAN:    Diabetes type 2 insulin requiring with BMI under 30:   Current regimen: Basal bolus insulin with 140 units of  Tresiba  A1c is most recently 13.6  She is mostly taking basal insulin and large amounts However her blood sugar patterns appear to be indicated above significant insulin deficiency  Difficult to assess her control recently because of not being able to use the Dexcom consistently Also not clear if stopping Ozempic is causing more variability especially with overnight sugars She does only take 10 units of NovoLog for most meals and difficult to assess postprandial readings 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 with significant hypoglycemia seen in the office without any symptoms today Hypoglycemia today was treated with juice and glucose tablets in the office and glucose was increasing and over 60 when patient was allowed to leave  Recommendations:  Again discussed  adjustment of her mealtime dose based on her meal size and carbohydrate intake but likely needs nutritionist to evaluate this and help her She needs to go up to as much as 20 units for eating out or larger meals or desserts but otherwise generally takes 10 to 15 units for average meals, likely needs somewhat larger dose at breakfast and lunch based on her above blood sugar patterns Follow-up consultation with diabetes educator as needed to consider insulin pump which would be ideal option for reducing variability in hypoglycemia as well as better overnight control However since she does not have Dexcom on her phone will likely need to have a [employer] system For now she will reduce her Tresiba to 130 units since today's low blood sugar indicates excessive basal insulin Discussed that she can use her Dexcom on her abdomen if it does not stay consistently on her arm She will continue to use the tape to keep it in place and also [place] Follow-up in 6 to 8 weeks Prescription for GVoke pen device has been sent and given her sister information on how to use this for severe hypoglycemia Accu-Chek device was also given for her  to use as a backup instead of the generic monitor as this could be downloaded Discussed blood sugar targets fasting and after meals and to call if having any excessive hypoglycemia  Patient Instructions  Reduce Tresiba to 130 units  Total visit time including counseling, management of in office hypoglycemia = 40 minutes   05/24/2022, 4:54 PM

## 2022-05-24 NOTE — Patient Instructions (Signed)
Reduce Tresiba to 130 units

## 2022-06-12 ENCOUNTER — Telehealth: Payer: Self-pay

## 2022-06-12 NOTE — Telephone Encounter (Signed)
Patient left voicemail that 10 days supply for Emma Payne is over $200 and 90 days is $700. Wants to know if she can be put on a lease expensive medication.

## 2022-06-13 ENCOUNTER — Other Ambulatory Visit (HOSPITAL_COMMUNITY): Payer: Self-pay

## 2022-06-14 NOTE — Telephone Encounter (Signed)
Attempted to call patient; no answer; left voice mail.

## 2022-06-15 ENCOUNTER — Other Ambulatory Visit: Payer: Self-pay

## 2022-06-15 DIAGNOSIS — E1165 Type 2 diabetes mellitus with hyperglycemia: Secondary | ICD-10-CM

## 2022-06-15 MED ORDER — NOVOLOG FLEXPEN 100 UNIT/ML ~~LOC~~ SOPN: PEN_INJECTOR | SUBCUTANEOUS | 2 refills | Status: DC

## 2022-06-16 NOTE — Telephone Encounter (Signed)
Spoke with patient. She will try to get copay card and see if she can get medication cheaper.

## 2022-06-17 ENCOUNTER — Other Ambulatory Visit: Payer: Self-pay | Admitting: Endocrinology

## 2022-06-20 ENCOUNTER — Telehealth: Payer: Self-pay | Admitting: Nutrition

## 2022-06-20 ENCOUNTER — Encounter: Payer: No Typology Code available for payment source | Attending: Endocrinology | Admitting: Nutrition

## 2022-06-20 DIAGNOSIS — E118 Type 2 diabetes mellitus with unspecified complications: Secondary | ICD-10-CM | POA: Diagnosis present

## 2022-06-20 DIAGNOSIS — E1165 Type 2 diabetes mellitus with hyperglycemia: Secondary | ICD-10-CM | POA: Diagnosis present

## 2022-06-20 NOTE — Telephone Encounter (Signed)
Patient was taking Tresiba 130u q HS, and Novolog 10u ac meals.  She was having many low blood sugars and reduced her Tresiba dose to 65u 1 week ago, and then stopped her Tyler Aas for last 3 days.  FBS today was 157 and acL was 129.   Dr. Kelton Pillar contacted and per her verbal order, patient was told to take Tresiba 20u q HS, with Novolog.

## 2022-06-20 NOTE — Progress Notes (Signed)
Patient is here today for a review of her blood sugars.  She reports that Dr. Dwyane Dee reduced her dose of Tresiba to 130u.  She reports that she continues to have low blood sugars several times during the night of 40-70.  She reduced her [place] Aas to 60u 1 week ago, had another low of 67 at 3AM, despite a HS snack of 1/2 sandwich.  She then stopped all [place] X3 days now.  FBS today was 157.  Accu-Chek meter download for last 4 days were variable, but none over 185.  She continues to take her Novolog-10u ac meals.  Blood sugar acL was 127.   Dr. Kelton Pillar was called and written instructions were given to patient to start back on Tresiba 20u q HS, and Novolog 10u ac meals.   We reviewed her diet and her meals are balance, but sometimes low in carbs. She was given a handout of 15 gram carb portion sizes and told to take 2-3servings acB and acL and to make sure she has 3 servings acS.  She reported good understanding of this.  It was also stressed to have protein at every meals and to make sure her supper meal has at least 3 ounces of protein.  She agreed to do this. She was told to call me tomorrow with FBS and acL reading.  She agreed to do this and had no final questions.

## 2022-06-20 NOTE — Patient Instructions (Signed)
Reduce the tresiba dose to 20u every night Take 10u Novolog before all meals.  Make sure all meals have 2-3 servings of carbohydrate, with protein at every meal, and a small amount of fat.  Call if blood sugar readings drop low, or if they remain over 220.

## 2022-06-22 ENCOUNTER — Telehealth: Payer: Self-pay | Admitting: Dietician

## 2022-06-22 NOTE — Telephone Encounter (Signed)
Returned patient call. Chart reviewed.  Emma Payne had been reduced to 20 units due to frequent hypoglycemia.  She is now taking 20 units of Tresiba q HS and Novolog 10-20 units before meals depending on meal size as prescribed.  She is testing her blood glucose with a finger prick "as this is more accurate" as well as using a Dexcom with a receiver. Fasting blood glucose this am was 145 per meter and 174 per sensor, 202 per sensor prior to lunch and 195 after lunch, and 140 per meter and 160 per sensor prior to bed last night.  She is having no further lows. Will forward this information to [place].  Antonieta Iba, RD, LDN, CDCES

## 2022-06-26 NOTE — Telephone Encounter (Signed)
Message forwarded.

## 2022-06-28 ENCOUNTER — Encounter: Payer: Self-pay | Admitting: Endocrinology

## 2022-06-28 ENCOUNTER — Other Ambulatory Visit: Payer: Self-pay | Admitting: Endocrinology

## 2022-06-28 ENCOUNTER — Other Ambulatory Visit (INDEPENDENT_AMBULATORY_CARE_PROVIDER_SITE_OTHER): Payer: No Typology Code available for payment source

## 2022-06-28 DIAGNOSIS — E1065 Type 1 diabetes mellitus with hyperglycemia: Secondary | ICD-10-CM

## 2022-06-30 LAB — BASIC METABOLIC PANEL
BUN/Creatinine Ratio: 22 (calc) (ref 6–22)
BUN: 24 mg/dL (ref 7–25)
CO2: 20 mmol/L (ref 20–32)
Calcium: 9.5 mg/dL (ref 8.6–10.4)
Chloride: 113 mmol/L — ABNORMAL HIGH (ref 98–110)
Creat: 1.08 mg/dL — ABNORMAL HIGH (ref 0.50–1.05)
Glucose, Bld: 121 mg/dL — ABNORMAL HIGH (ref 65–99)
Potassium: 4.3 mmol/L (ref 3.5–5.3)
Sodium: 143 mmol/L (ref 135–146)

## 2022-06-30 LAB — FRUCTOSAMINE: Fructosamine: 284 umol/L (ref 205–285)

## 2022-07-13 ENCOUNTER — Ambulatory Visit: Payer: No Typology Code available for payment source | Admitting: Endocrinology

## 2022-07-13 ENCOUNTER — Encounter: Payer: Self-pay | Admitting: Endocrinology

## 2022-07-13 VITALS — BP 132/78 | HR 77 | Ht 62.0 in | Wt 161.8 lb

## 2022-07-13 DIAGNOSIS — Z794 Long term (current) use of insulin: Secondary | ICD-10-CM

## 2022-07-13 DIAGNOSIS — E1165 Type 2 diabetes mellitus with hyperglycemia: Secondary | ICD-10-CM

## 2022-07-13 LAB — POCT GLYCOSYLATED HEMOGLOBIN (HGB A1C): Hemoglobin A1C: 7.5 % — AB (ref 4.0–5.6)

## 2022-07-13 NOTE — Patient Instructions (Addendum)
Novolog 12-12-8 or 16  Check blood sugars on waking up days a week  Also check blood sugars about 2 hours after meals and do this after different meals by rotation  Recommended blood sugar levels on waking up are 90-130 and about 2 hours after meal is 130-180  Please bring your blood sugar monitor to each visit, thank you   No candy bars

## 2022-07-13 NOTE — Progress Notes (Signed)
Patient ID: Emma Payne, female   DOB: Jul 27, 1960, 62 y.o.   MRN: [id number]           Reason for Appointment: Type II Diabetes follow-up   History of Present Illness   Diagnosis date: 2002  Previous history:  Oral hypoglycemic drugs previously used: Farxiga, Trulicity Insulin was started in 2018  A1c range in the last few years is: 7.5-11.5  Recent history:     Non-insulin hypoglycemic drugs: None     Insulin regimen: NovoLog 10-20 units before meals Tresiba 140 before dinner        Side effects from medications: Yeast infections from [place]  Current self management, blood sugar patterns and problems identified:  A1c previously was 13.5 as of 11/23 and now 7.5 Fructosamine is 284 Although she had been taking as much is 140 units of Tresiba in the past more recently has taken only 20 units because of tendency to frequent hypoglycemia, has been taking this dose since 06/20/2022 She has been able to continue the Dexcom most of the time using the reader Occasionally sensor may fall off early; however she has been seen by the diabetes educator lately She was started on NOVOLOG in 6/23 Although she has not been taking Ozempic, does not think her blood sugars are different with stopping this She was told to take NovoLog based on her meal size but with taking 10 units at dinnertime her blood sugars appear to be relatively lower especially towards bedtime when they may be occasionally low She however says that she will eat her she candy bar when she has a low blood sugar and if the blood sugar does not come up quickly she will drink some juice like apple juice or glucose drink Also when she is eating a high carbohydrate meal like pasta and then she will take 20 units This usually covers it although last night with getting manic already her blood sugar went up over 250 She is asking about how to correct high readings She was told to discuss insulin pump with the diabetes  educator but apparently did not get time to do this Her sister is helping her with diabetes management, glucagon was prescribed also  Eating breakfast at around 7 AM, usually an egg sandwich, lunch around 10 AM and dinner around 7-8 PM  Blood sugar data by [facility] download review as follows for the last 2 weeks  Blood sugar was not documented on 1/26, 1/21 and also 2/2 and 2/3 She has moderate variability especially overnight and early part of the day OVERNIGHT blood sugars are quite variable with occasional spikes around midnight or slightly later but generally preceded by low blood sugars, hypoglycemia usually mild and transient around midnight POSTPRANDIAL readings during the day are generally higher in the morning hours with some variability and usually overall averaging 213 Blood sugars after lunch are generally level compared to Premeal readings with only mild increase that time Blood sugars after dinner are primarily decreasing after the meal and occasionally low normal or low before midnight  CGM use % of time   2-week average/GV 173/29.7  Time in range 58     %  % Time Above 180 33  % Time above 250 8  % Time Below 70 1      Prior   PRE-MEAL Fasting Lunch Dinner Bedtime Overall  Glucose range: 106-254 213-379 96-189 227   Mean/median:     130   POST-MEAL PC Breakfast PC Lunch [employer]  Glucose range:   ?  Mean/median:                  CDE visit: Most recent: 1/24  Weight control:  Wt Readings from Last 3 Encounters:  07/13/22 161 lb 12.8 oz (73.4 kg)  05/24/22 154 lb (69.9 kg)  02/09/22 152 lb 6.4 oz (69.1 kg)            Diabetes labs:  Lab Results  Component Value Date   HGBA1C 7.5 (A) 07/13/2022   HGBA1C 7.5 (A) 02/09/2022   HGBA1C 11.1 (A) 09/23/2021   Lab Results  Component Value Date   MICROALBUR 2.0 02/09/2022   LDLCALC 114 (H) 02/09/2022   CREATININE 1.08 (H) 06/28/2022     Allergies as of 07/13/2022       Reactions   Aptiom  [eslicarbazepine]    Causes seizures   Cedax [ceftibuten]    Causes vomiting   Prednisone    Seizures   Vimpat [lacosamide]    Causes seizures        Medication List        Accurate as of July 13, 2022  3:41 PM. If you have any questions, ask your nurse or doctor.          Accu-Chek Guide test strip Generic drug: glucose blood Use to check blood 3 times a day   Accu-Chek Softclix Lancets lancets Use to check blood sugar 3 times a day   acetaminophen-codeine 300-30 MG tablet Commonly known as: TYLENOL #3 Take 1 tablet by mouth 3 (three) times daily as needed.   acetaZOLAMIDE 250 MG tablet Commonly known as: DIAMOX Take 250 mg by mouth at bedtime. 1 tablet in morning and 1 at night   albuterol 108 (90 Base) MCG/ACT inhaler Commonly known as: VENTOLIN HFA Inhale 2 puffs into the lungs every 6 (six) hours as needed for wheezing or shortness of breath.   aspirin 81 MG chewable tablet Chew 81 mg by mouth daily.   BD Integra Syringe 25G X 1" 3 ML Misc Generic drug: SYRINGE-NEEDLE (DISP) 3 ML   BD Pen Needle Nano 2nd Gen 32G X 4 MM Misc Generic drug: Insulin Pen Needle USE 4 TIMES A DAY   B-D UF III MINI PEN NEEDLES 31G X 5 MM Misc Generic drug: Insulin Pen Needle 1 DEVICE BY OTHER ROUTE SEE ADMIN INSTRUCTIONS. 1 NEEDLE 5 TIMES PER DAY   celecoxib 200 MG capsule Commonly known as: CELEBREX Take by mouth 2 (two) times daily.   clobetasol cream 0.05 % Commonly known as: TEMOVATE Apply 1 application  topically 2 (two) times daily.   clonazePAM 1 MG tablet Commonly known as: KLONOPIN Take 0.5-1.5 mg by mouth See admin instructions. Taking 0.5 mg in the AM, 0.5mg  at3pm and 1.5 mg at bedtime   cyanocobalamin 1000 MCG/ML injection Commonly known as: VITAMIN B12 Inject 1,000 mcg into the muscle every 30 (thirty) days.   Dexcom G7 Sensor Misc 1 DEVICE BY DOES NOT APPLY ROUTE AS DIRECTED. CHANGE SENSOR EVERY 10 DAYS   Dextromethorphan-quiNIDine 20-10 MG  capsule Commonly known as: NUEDEXTA Take 1 capsule by mouth 2 (two) times daily.   dicyclomine 10 MG capsule Commonly known as: BENTYL Take 10 mg by mouth 2 (two) times daily as needed for spasms.   FLUoxetine 40 MG capsule Commonly known as: PROZAC Take 80 mg by mouth every morning.   Gvoke HypoPen 2-Pack 0.5 MG/0.1ML Soaj Generic drug: Glucagon Inject for severe hypoglycemia   irbesartan 150 MG  tablet Commonly known as: AVAPRO Take 150 mg by mouth daily.   ketoconazole 2 % shampoo Commonly known as: NIZORAL Apply 1 application  topically 2 (two) times a week.   ketoconazole 2 % cream Commonly known as: NIZORAL Apply 1 application  topically 2 (two) times daily.   lamoTRIgine 200 MG tablet Commonly known as: LAMICTAL 2 (two) times daily.   montelukast 10 MG tablet Commonly known as: SINGULAIR Take 10 mg by mouth at bedtime.   neomycin-polymyxin b-dexamethasone 3.5-10000-0.1 Oint Commonly known as: MAXITROL Place 1 application  into both eyes once a week. Apply to eyelids   NovoLOG FlexPen 100 UNIT/ML FlexPen Generic drug: insulin aspart TAKE BEFORE EACH MEAL, STARTING WITH 10 UNITS AND INCREASING DOSE AS DIRECTED, MAXIMUM 30 UNITS AT [facility] MEAL   ondansetron 8 MG disintegrating tablet Commonly known as: ZOFRAN-ODT Take 8 mg by mouth every 8 (eight) hours as needed for nausea/vomiting.   Ozempic (2 MG/DOSE) 8 MG/3ML Sopn Generic drug: Semaglutide (2 MG/DOSE) Inject 2 mg into the skin once a week.   PEG 3350 17 GM/SCOOP Powd Take 51 g by mouth daily as needed (constipation). 3 scoops   QUEtiapine 25 MG tablet Commonly known as: SEROQUEL Take 50 mg by mouth at bedtime.   Qulipta 60 MG Tabs Generic drug: Atogepant Take 60 mg by mouth daily.   rizatriptan 10 MG disintegrating tablet Commonly known as: MAXALT-MLT Take 10 mg by mouth daily as needed for migraine.   rosuvastatin 10 MG tablet Commonly known as: CRESTOR Take 10 mg by mouth at bedtime.    Tyler Aas FlexTouch 100 UNIT/ML FlexTouch Pen Generic drug: insulin degludec Inject 140 units daily What changed: additional instructions   vitamin C 1000 MG tablet Take 1,000 mg by mouth daily.        Allergies:  Allergies  Allergen Reactions   Aptiom [Eslicarbazepine]     Causes seizures   Cedax [Ceftibuten]     Causes vomiting   Prednisone     Seizures   Vimpat [Lacosamide]     Causes seizures    Past Medical History:  Diagnosis Date   Diabetes ([place])     No past surgical history on file.  Family History  Problem Relation Age of Onset   Diabetes Father     Social History:  reports that she has never smoked. She has never used smokeless tobacco. She reports that she does not drink alcohol. No history on file for drug use.  Review of Systems:  Last diabetic eye exam date 04/2020  Last foot exam date: 02/25/2021  Most Recent microalbumin level 9/23  Symptoms of neuropathy: She has some tingling in her feet  Hypertension:   Treatment includes irbesartan 150 mg daily  BP Readings from Last 3 Encounters:  07/13/22 132/78  05/24/22 (!) 152/84  02/09/22 132/86    Lipids:  Treated with Crestor 10 mg daily by PCP    Lab Results  Component Value Date   CHOL 201 (H) 02/09/2022   Lab Results  Component Value Date   HDL 51 02/09/2022   Lab Results  Component Value Date   LDLCALC 114 (H) 02/09/2022   Lab Results  Component Value Date   TRIG 255 (H) 02/09/2022   Lab Results  Component Value Date   CHOLHDL 3.9 02/09/2022   No results found for: "LDLDIRECT"  No history of thyroid disease  Lab Results  Component Value Date   TSH 1.60 03/01/2021   TSH 2.17 01/17/2019   FREET4 1.1  03/01/2021   She has history of PTSD and mild difficulties with memory  "Gastroparesis"   Examination:   BP 132/78 (BP Location: Left Arm, Patient Position: Sitting, Cuff Size: Normal)   Pulse 77   Ht 5\' 2"  (1.575 m)   Wt 161 lb 12.8 oz (73.4 kg)   SpO2 92%    BMI 29.59 kg/m   Body mass index is 29.59 kg/m.   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:   See history of present illness for detailed discussion of current diabetes management, blood sugar patterns and problems identified  Current regimen: Basal bolus insulin with 20 units of Tresiba  A1c is down to 7.5  Her blood sugars are relatively labile but recently requiring much less insulin for unknown reasons  Day-to-day management and blood sugar patterns were reviewed in detail as above  Recommendations:  She needs to reduce her insulin coverage at dinnertime by at least 2 units and only take as much is 16 units for higher carbohydrate meals For now since overnight sugars are not consistently high or low will continue 20 units of basal insulin She will discuss insulin pumps with diabetes educator and likely will need Medtronic pump since she cannot download the Dexcom app on her phone Take mealtime insulin 10 to 15-minute before eating Stop eating candy bars for low blood sugars and take only simple sugars  Patient Instructions  Novolog 12-12-8 or 16  Check blood sugars on waking up days a week  Also check blood sugars about 2 hours after meals and do this after different meals by rotation  Recommended blood sugar levels on waking up are 90-130 and about 2 hours after meal is 130-180  Please bring your blood sugar monitor to each visit, thank you   No candy bars    Total visit time including counseling = 30 minutes    Emma Payne 07/13/2022, 3:41 PM

## 2022-07-18 ENCOUNTER — Encounter: Payer: Self-pay | Admitting: Endocrinology

## 2022-09-11 ENCOUNTER — Telehealth: Payer: Self-pay

## 2022-09-11 NOTE — Telephone Encounter (Signed)
Called and left a detailed message for pt with provider instructions as well as Mychart message.

## 2022-09-11 NOTE — Telephone Encounter (Signed)
Pt called to advise her blood sugars have been in the high 200's and 300. She is thinking she needs an adjustment to her insulin regimen. Pt is taking Tresiba 20 units daily and Novolog 12 units before b'fast and lunch and 8 units before dinner.

## 2022-10-10 ENCOUNTER — Encounter: Payer: Self-pay | Admitting: Endocrinology

## 2022-10-13 ENCOUNTER — Other Ambulatory Visit: Payer: Self-pay | Admitting: Endocrinology

## 2022-10-16 ENCOUNTER — Ambulatory Visit: Payer: No Typology Code available for payment source | Admitting: Endocrinology

## 2022-11-15 NOTE — Progress Notes (Deleted)
Patient ID: Emma Payne, female   DOB: 06/17/1960, 61 y.o.   MRN: [id number]           Reason for Appointment: Type II Diabetes follow-up   History of Present Illness   Diagnosis date: 2002  Previous history:  Oral hypoglycemic drugs previously used: Farxiga, Trulicity Insulin was started in 2018  A1c range in the last few years is: 7.5-11.5  Recent history:     Non-insulin hypoglycemic drugs: None     Insulin regimen: NovoLog 10-20 units before meals Tresiba 140 before dinner        Side effects from medications: Yeast infections from Farxiga and [employer]  Current self management, blood sugar patterns and problems identified:  A1c previously was 13.5 as of 11/23 and now 7.5 Fructosamine is 284 Although she had been taking as much is 140 units of Tresiba in the past more recently has taken only 20 units because of tendency to frequent hypoglycemia, has been taking this dose since 06/20/2022 She has been able to continue the Dexcom most of the time using the reader Occasionally sensor may fall off early; however she has been seen by the diabetes educator lately She was started on NOVOLOG in 6/23 Although she has not been taking Ozempic, does not think her blood sugars are different with stopping this She was told to take NovoLog based on her meal size but with taking 10 units at dinnertime her blood sugars appear to be relatively lower especially towards bedtime when they may be occasionally low She however says that she will eat her she candy bar when she has a low blood sugar and if the blood sugar does not come up quickly she will drink some juice like apple juice or glucose drink Also when she is eating a high carbohydrate meal like pasta and then she will take 20 units This usually covers it although last night with getting manic already her blood sugar went up over 250 She is asking about how to correct high readings She was told to discuss insulin pump with the diabetes  educator but apparently did not get time to do this Her sister is helping her with diabetes management, glucagon was prescribed also  Eating breakfast at around 7 AM, usually an egg sandwich, lunch around 10 AM and dinner around 7-8 PM  Blood sugar data by Dexcom download review as follows for the last 2 weeks  Blood sugar was not documented on 1/26, 1/21 and also 2/2 and 2/3 She has moderate variability especially overnight and early part of the day OVERNIGHT blood sugars are quite variable with occasional spikes around midnight or slightly later but generally preceded by low blood sugars, hypoglycemia usually mild and transient around midnight POSTPRANDIAL readings during the day are generally higher in the morning hours with some variability and usually overall averaging 213 Blood sugars after lunch are generally level compared to Premeal readings with only mild increase that time Blood sugars after dinner are primarily decreasing after the meal and occasionally low normal or low before midnight  CGM use % of time   2-week average/GV 173/29.7  Time in range 58     %  % Time Above 180 33  % Time above 250 8  % Time Below 70 1      Prior   PRE-MEAL Fasting Lunch Dinner Bedtime Overall  Glucose range: 106-254 213-379 96-189 227   Mean/median:     130   POST-MEAL PC Breakfast PC Lunch PC Dinner    Glucose range:   ?  Mean/median:                  CDE visit: Most recent: 1/24  Weight control:  Wt Readings from Last 3 Encounters:  07/13/22 161 lb 12.8 oz (73.4 kg)  05/24/22 154 lb (69.9 kg)  02/09/22 152 lb 6.4 oz (69.1 kg)            Diabetes labs:  Lab Results  Component Value Date   HGBA1C 7.5 (A) 07/13/2022   HGBA1C 7.5 (A) 02/09/2022   HGBA1C 11.1 (A) 09/23/2021   Lab Results  Component Value Date   MICROALBUR 2.0 02/09/2022   LDLCALC 114 (H) 02/09/2022   CREATININE 1.08 (H) 06/28/2022     Allergies as of 07/13/2022       Reactions   Aptiom  [eslicarbazepine]    Causes seizures   Cedax [ceftibuten]    Causes vomiting   Prednisone    Seizures   Vimpat [lacosamide]    Causes seizures        Medication List        Accurate as of July 13, 2022  3:41 PM. If you have any questions, ask your nurse or doctor.          Accu-Chek Guide test strip Generic drug: glucose blood Use to check blood 3 times a day   Accu-Chek Softclix Lancets lancets Use to check blood sugar 3 times a day   acetaminophen-codeine 300-30 MG tablet Commonly known as: TYLENOL #3 Take 1 tablet by mouth 3 (three) times daily as needed.   acetaZOLAMIDE 250 MG tablet Commonly known as: DIAMOX Take 250 mg by mouth at bedtime. 1 tablet in morning and 1 at night   albuterol 108 (90 Base) MCG/ACT inhaler Commonly known as: VENTOLIN HFA Inhale 2 puffs into the lungs every 6 (six) hours as needed for wheezing or shortness of breath.   aspirin 81 MG chewable tablet Chew 81 mg by mouth daily.   BD Integra Syringe 25G X 1" 3 ML Misc Generic drug: SYRINGE-NEEDLE (DISP) 3 ML   BD Pen Needle Nano 2nd Gen 32G X 4 MM Misc Generic drug: Insulin Pen Needle USE 4 TIMES A DAY   B-D UF III MINI PEN NEEDLES 31G X 5 MM Misc Generic drug: Insulin Pen Needle 1 DEVICE BY OTHER ROUTE SEE ADMIN INSTRUCTIONS. 1 NEEDLE 5 TIMES PER DAY   celecoxib 200 MG capsule Commonly known as: CELEBREX Take by mouth 2 (two) times daily.   clobetasol cream 0.05 % Commonly known as: TEMOVATE Apply 1 application  topically 2 (two) times daily.   clonazePAM 1 MG tablet Commonly known as: KLONOPIN Take 0.5-1.5 mg by mouth See admin instructions. Taking 0.5 mg in the AM, 0.5mg at3pm and 1.5 mg at bedtime   cyanocobalamin 1000 MCG/ML injection Commonly known as: VITAMIN B12 Inject 1,000 mcg into the muscle every 30 (thirty) days.   Dexcom G7 Sensor Misc 1 DEVICE BY DOES NOT APPLY ROUTE AS DIRECTED. CHANGE SENSOR EVERY 10 DAYS   Dextromethorphan-quiNIDine 20-10 MG  capsule Commonly known as: NUEDEXTA Take 1 capsule by mouth 2 (two) times daily.   dicyclomine 10 MG capsule Commonly known as: BENTYL Take 10 mg by mouth 2 (two) times daily as needed for spasms.   FLUoxetine 40 MG capsule Commonly known as: PROZAC Take 80 mg by mouth every morning.   Gvoke HypoPen 2-Pack 0.5 MG/0.1ML Soaj Generic drug: Glucagon Inject for severe hypoglycemia   irbesartan 150 MG   tablet Commonly known as: AVAPRO Take 150 mg by mouth daily.   ketoconazole 2 % shampoo Commonly known as: NIZORAL Apply 1 application  topically 2 (two) times a week.   ketoconazole 2 % cream Commonly known as: NIZORAL Apply 1 application  topically 2 (two) times daily.   lamoTRIgine 200 MG tablet Commonly known as: LAMICTAL 2 (two) times daily.   montelukast 10 MG tablet Commonly known as: SINGULAIR Take 10 mg by mouth at bedtime.   neomycin-polymyxin b-dexamethasone 3.5-10000-0.1 Oint Commonly known as: MAXITROL Place 1 application  into both eyes once a week. Apply to eyelids   NovoLOG FlexPen 100 UNIT/ML FlexPen Generic drug: insulin aspart TAKE BEFORE EACH MEAL, STARTING WITH 10 UNITS AND INCREASING DOSE AS DIRECTED, MAXIMUM 30 UNITS AT EACH MEAL   ondansetron 8 MG disintegrating tablet Commonly known as: ZOFRAN-ODT Take 8 mg by mouth every 8 (eight) hours as needed for nausea/vomiting.   Ozempic (2 MG/DOSE) 8 MG/3ML Sopn Generic drug: Semaglutide (2 MG/DOSE) Inject 2 mg into the skin once a week.   PEG 3350 17 GM/SCOOP Powd Take 51 g by mouth daily as needed (constipation). 3 scoops   QUEtiapine 25 MG tablet Commonly known as: SEROQUEL Take 50 mg by mouth at bedtime.   Qulipta 60 MG Tabs Generic drug: Atogepant Take 60 mg by mouth daily.   rizatriptan 10 MG disintegrating tablet Commonly known as: MAXALT-MLT Take 10 mg by mouth daily as needed for migraine.   rosuvastatin 10 MG tablet Commonly known as: CRESTOR Take 10 mg by mouth at bedtime.    Tresiba FlexTouch 100 UNIT/ML FlexTouch Pen Generic drug: insulin degludec Inject 140 units daily What changed: additional instructions   vitamin C 1000 MG tablet Take 1,000 mg by mouth daily.        Allergies:  Allergies  Allergen Reactions   Aptiom [Eslicarbazepine]     Causes seizures   Cedax [Ceftibuten]     Causes vomiting   Prednisone     Seizures   Vimpat [Lacosamide]     Causes seizures    Past Medical History:  Diagnosis Date   Diabetes ([facility])     No past surgical history on file.  Family History  Problem Relation Age of Onset   Diabetes Father     Social History:  reports that she has never smoked. She has never used smokeless tobacco. She reports that she does not drink alcohol. No history on file for drug use.  Review of Systems:  Last diabetic eye exam date 04/2020  Last foot exam date: 02/25/2021  Most Recent microalbumin level 9/23  Symptoms of neuropathy: She has some tingling in her feet  Hypertension:   Treatment includes irbesartan 150 mg daily  BP Readings from Last 3 Encounters:  07/13/22 132/78  05/24/22 (!) 152/84  02/09/22 132/86    Lipids:  Treated with Crestor 10 mg daily by PCP    Lab Results  Component Value Date   CHOL 201 (H) 02/09/2022   Lab Results  Component Value Date   HDL 51 02/09/2022   Lab Results  Component Value Date   LDLCALC 114 (H) 02/09/2022   Lab Results  Component Value Date   TRIG 255 (H) 02/09/2022   Lab Results  Component Value Date   CHOLHDL 3.9 02/09/2022   No results found for: "LDLDIRECT"  No history of thyroid disease  Lab Results  Component Value Date   TSH 1.60 03/01/2021   TSH 2.17 01/17/2019   FREET4 1.1   03/01/2021   She has history of PTSD and mild difficulties with memory  "Gastroparesis"   Examination:   BP 132/78 (BP Location: Left Arm, Patient Position: Sitting, Cuff Size: Normal)   Pulse 77   Ht 5' 2" (1.575 m)   Wt 161 lb 12.8 oz (73.4 kg)   SpO2 92%    BMI 29.59 kg/m   Body mass index is 29.59 kg/m.   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:   See history of present illness for detailed discussion of current diabetes management, blood sugar patterns and problems identified  Current regimen: Basal bolus insulin with 20 units of Tresiba  A1c is down to 7.5  Her blood sugars are relatively labile but recently requiring much less insulin for unknown reasons  Day-to-day management and blood sugar patterns were reviewed in detail as above  Recommendations:  She needs to reduce her insulin coverage at dinnertime by at least 2 units and only take as much is 16 units for higher carbohydrate meals For now since overnight sugars are not consistently high or low will continue 20 units of basal insulin She will discuss insulin pumps with diabetes educator and likely will need [employer]tr[employer] pump since she cannot download the Dexcom app on her phone Take mealtime insulin 10 to 15-minute before eating Stop eating candy bars for low blood sugars and take only simple sugars  Patient Instructions  Novolog 12-12-8 or 16  Check blood sugars on waking up days a week  Also check blood sugars about 2 hours after meals and do this after different meals by rotation  Recommended blood sugar levels on waking up are 90-130 and about 2 hours after meal is 130-180  Please bring your blood sugar monitor to each visit, thank you   No candy bars    Total visit time including counseling = 30 minutes    Jerold Yoss 07/13/2022, 3:41 PM   

## 2022-11-16 ENCOUNTER — Ambulatory Visit: Payer: No Typology Code available for payment source | Admitting: Endocrinology

## 2022-12-20 ENCOUNTER — Other Ambulatory Visit (HOSPITAL_COMMUNITY): Payer: Self-pay

## 2022-12-20 ENCOUNTER — Telehealth: Payer: Self-pay

## 2022-12-20 NOTE — Telephone Encounter (Signed)
Pharmacy Patient Advocate Encounter   Received notification from CoverMyMeds that prior authorization for Dexcom G7 sensor is required/requested.   Insurance verification completed.   The patient is insured through [employer] [facility] .   Per test claim: PA submitted to [employer] [facility] via CoverMyMeds Key/confirmation #/EOC [id number] Status is pending

## 2022-12-26 ENCOUNTER — Other Ambulatory Visit (HOSPITAL_COMMUNITY): Payer: Self-pay

## 2022-12-26 NOTE — Telephone Encounter (Signed)
Pharmacy Patient Advocate Encounter  Received notification from [employer] [facility] that Prior Authorization for Dexcom G7 sensors have been APPROVED from 12/20/22 to 12/20/23.Marland Kitchen  PA #/Case ID/Reference #: PA Case ID #: [id number]  Per test claim, Copay is $90.75 for 1 month.

## 2022-12-28 ENCOUNTER — Telehealth: Payer: Self-pay

## 2022-12-28 NOTE — Telephone Encounter (Signed)
Patient was called in regards to her PA for the Dexcom G7 sensors.

## 2023-01-02 ENCOUNTER — Encounter: Payer: Self-pay | Admitting: Endocrinology

## 2023-01-03 NOTE — Telephone Encounter (Signed)
Please assist with getting patient scheduled.   Emma Payne

## 2023-01-11 ENCOUNTER — Ambulatory Visit: Payer: No Typology Code available for payment source | Admitting: Endocrinology

## 2023-01-18 ENCOUNTER — Encounter: Payer: Self-pay | Admitting: Endocrinology

## 2023-01-18 ENCOUNTER — Ambulatory Visit: Payer: No Typology Code available for payment source | Admitting: Endocrinology

## 2023-01-18 ENCOUNTER — Other Ambulatory Visit: Payer: Self-pay | Admitting: Endocrinology

## 2023-01-18 VITALS — BP 160/98 | HR 68 | Ht 62.0 in | Wt 172.4 lb

## 2023-01-18 DIAGNOSIS — Z794 Long term (current) use of insulin: Secondary | ICD-10-CM

## 2023-01-18 DIAGNOSIS — E1165 Type 2 diabetes mellitus with hyperglycemia: Secondary | ICD-10-CM

## 2023-01-18 LAB — POCT GLYCOSYLATED HEMOGLOBIN (HGB A1C): Hemoglobin A1C: 11.1 % — AB (ref 4.0–5.6)

## 2023-01-18 MED ORDER — TRESIBA FLEXTOUCH 100 UNIT/ML ~~LOC~~ SOPN
PEN_INJECTOR | SUBCUTANEOUS | 4 refills | Status: AC
Start: 2023-01-18 — End: ?

## 2023-01-18 MED ORDER — ACCU-CHEK GUIDE VI STRP: ORAL_STRIP | 12 refills | Status: AC

## 2023-01-18 MED ORDER — NOVOLOG FLEXPEN 100 UNIT/ML ~~LOC~~ SOPN
PEN_INJECTOR | SUBCUTANEOUS | 4 refills | Status: DC
Start: 2023-01-18 — End: 2023-01-18

## 2023-01-18 NOTE — Progress Notes (Signed)
Outpatient Endocrinology Note [name], MD  01/19/23  Patient's Name: Emma Payne    DOB: 12-Jun-1960    MRN: [id number]                                                    REASON OF VISIT: Follow up of type 2 diabetes mellitus  PCP: Arnette Norris, MD  HISTORY OF PRESENT ILLNESS:   Emma Payne is a 62 y.o. old female with past medical history listed below, is here for follow up of type 2 diabetes mellitus.  Patient was last seen by Dr. Lucianne Muss in February 2024.  Pertinent Diabetes History: Patient was diagnosed with type 2 diabetes mellitus in 2002.  Hemoglobin A1c in the last few years in the range of 7.5 to 11.5%.  Insulin therapy was started in 2018.  She used to be on Farxiga and Trulicity in the past.  Chronic Diabetes Complications : Retinopathy: no. Last ophthalmology exam was done on annually, reportedly. Nephropathy: no Peripheral neuropathy: no Coronary artery disease: no Stroke: no  Relevant comorbidities and cardiovascular risk factors: Obesity: yes Body mass index is 31.53 kg/m.  Hypertension: yes Hyperlipidemia. Yes, on statin  Current / Home Diabetic regimen includes: Novolog 18-18-16 units daily. Tresiba 20 units at bedtime.   Prior diabetic medications: Farxiga, trulicity, Ozempic  Glycemic data:    CONTINUOUS GLUCOSE MONITORING SYSTEM (CGMS) INTERPRETATION: At today's visit, we reviewed CGM downloads. The full report is scanned in the media. Reviewing the CGM trends, blood glucose are as follows:  Dexcom G7 CGM-  Sensor Download (Sensor download was reviewed and summarized below.) Dates: August 2 to January 18, 2023, 14 days.  Glucose Management Indicator: n/a% Sensor Average: 333 SD 72  Glycemic Trends:  <54: 0% 54-70: 0% 71-180: 3% 181-250: 13% 251-400: 84%  Interpretation: -Most of the blood sugar are significantly high most of the days especially with meal postprandially up to 300-400 range.  She at times has trending down blood  sugar to normal range and occasionally staying normal sugar overnight.  Some of the days for example August 9, August 8, August 7 she had significantly high blood sugar around 400s almost all day probably consistent with not taking insulin or due to illness.  With the pattern of blood sugar monitoring on Dexcom readings, she at times taking NovoLog making her blood sugar normal from hyperglycemia and most of the other times she is staying high probably missing NovoLog with meals.  No hypoglycemia.  Hypoglycemia: Patient has no hypoglycemic episodes. Patient has hypoglycemia awareness.  Factors modifying glucose control: 1.  Diabetic diet assessment: three meals a day, likes to eat bedtime snack with sandwich.  2.  Staying active or exercising: not formal exercise  3.  Medication compliance: compliant most of the time.  Interval history 01/19/23 Patient was last seen by Dr. Lucianne Muss in February 2024.  Patient reports that she is mostly staying high blood sugar for several weeks.  In December 2023 she was hospitalized due to hypoglycemia with blood sugar in 30s.  Evaristo Bury was significantly decreased at that time from 120 units daily to 20 units daily due to hypoglycemia risk.  Patient reports she was in and out of the hospital and rehab and also had seen gastroenterology due to diverticulitis at [facility] in [place] and not able to keep up with  her endocrinology follow-up for diabetes.  Patient reported a plan for colonoscopy in few days.  Patient is accompanied by her sister in the clinic today.  Hemoglobin A1C is 11.1% today.    Patient was evaluated in [facility] in December 2023, at CT abdomen showed mild diffuse pancreatic atrophy, with coarse calcification, changes reflect sequelae of chronic pancreatitis.  Patient does not recall having pancreatitis.   REVIEW OF SYSTEMS As per history of present illness.   PAST MEDICAL HISTORY: Past Medical History:  Diagnosis Date    Diabetes (HCC)     PAST SURGICAL HISTORY: History reviewed. No pertinent surgical history.  ALLERGIES: Allergies  Allergen Reactions   Aptiom [Eslicarbazepine]     Causes seizures   Cedax [Ceftibuten]     Causes vomiting   Prednisone     Seizures   Vimpat [Lacosamide]     Causes seizures    FAMILY HISTORY:  Family History  Problem Relation Age of Onset   Diabetes Father     SOCIAL HISTORY: Social History   Socioeconomic History   Marital status: Divorced    Spouse name: Not on file   Number of children: Not on file   Years of education: Not on file   Highest education level: Not on file  Occupational History   Not on file  Tobacco Use   Smoking status: Never   Smokeless tobacco: Never  Substance and Sexual Activity   Alcohol use: No   Drug use: Not on file   Sexual activity: Not on file  Other Topics Concern   Not on file  Social History Narrative   Not on file   Social Determinants of Health   Financial Resource Strain: Low Risk  (08/16/2022)   Received from [facility], [facility]   Overall Financial Resource Strain (CARDIA)    Difficulty of Paying Living Expenses: Not hard at all  Food Insecurity: Low Risk  (12/18/2022)   Received from [facility]   Food vital sign    Within the past 12 months, you worried that your food would run out before you got money to buy more: Never true    Within the past 12 months, the food you bought just didn't last and you didn't have money to get more. : Never true  Transportation Needs: Not on file (12/18/2022)  Physical Activity: Unknown (08/16/2022)   Received from [facility], [facility]   Exercise Vital Sign    Days of Exercise per Week: 0 days    Minutes of Exercise per Session: Not on file  Stress: Stress Concern Present (10/17/2022)   Received from [facility], [facility]   [employer] [facility] - Occupational Stress Questionnaire    Feeling of Stress : To some extent  Social  Connections: Socially Integrated (08/16/2022)   Received from [facility], [facility]   Social Network    How would you rate your social network (family, work, friends)?: Good participation with social networks    MEDICATIONS:  Current Outpatient Medications  Medication Sig Dispense Refill   Accu-Chek Softclix Lancets lancets Use to check blood sugar 3 times a day 100 each 12   acetaminophen-codeine (TYLENOL #3) 300-30 MG tablet Take 1 tablet by mouth 3 (three) times daily as needed.     acetaZOLAMIDE (DIAMOX) 250 MG tablet Take 250 mg by mouth at bedtime. 1 tablet in morning and 1 at night     albuterol (PROVENTIL HFA;VENTOLIN HFA) 108 (90 Base) MCG/ACT inhaler Inhale 2  puffs into the lungs every 6 (six) hours as needed for wheezing or shortness of breath.     Ascorbic Acid (VITAMIN C) 1000 MG tablet Take 1,000 mg by mouth daily.     aspirin 81 MG chewable tablet Chew 81 mg by mouth daily.     Atogepant (QULIPTA) 60 MG TABS Take 60 mg by mouth daily.     BD INTEGRA SYRINGE 25G X 1" 3 ML MISC      celecoxib (CELEBREX) 200 MG capsule Take by mouth 2 (two) times daily.     clonazePAM (KLONOPIN) 1 MG tablet Take 0.5-1.5 mg by mouth See admin instructions. Taking 0.5 mg in the AM, 0.5mg  at3pm and 1.5 mg at bedtime     Continuous Glucose Sensor (DEXCOM G7 SENSOR) MISC 1 DEVICE BY DOES NOT APPLY ROUTE AS DIRECTED. CHANGE SENSOR EVERY 10 DAYS 3 each 3   cyanocobalamin (,VITAMIN B-12,) 1000 MCG/ML injection Inject 1,000 mcg into the muscle every 30 (thirty) days.     Dextromethorphan-Quinidine 20-10 MG CAPS Take 1 capsule by mouth 2 (two) times daily.     dicyclomine (BENTYL) 10 MG capsule Take 10 mg by mouth 2 (two) times daily as needed for spasms.     FLUoxetine (PROZAC) 40 MG capsule Take 80 mg by mouth every morning.     Glucagon (GVOKE HYPOPEN 2-PACK) 0.5 MG/0.1ML SOAJ Inject for severe hypoglycemia 0.2 mL 1   Insulin Pen Needle (B-D UF III MINI PEN NEEDLES) 31G X 5 MM MISC 1 DEVICE BY  OTHER ROUTE SEE ADMIN INSTRUCTIONS. 1 NEEDLE 5 TIMES PER DAY 500 each 3   lamoTRIgine (LAMICTAL) 200 MG tablet 2 (two) times daily.      ondansetron (ZOFRAN-ODT) 8 MG disintegrating tablet Take 8 mg by mouth every 8 (eight) hours as needed for nausea/vomiting.     QUEtiapine (SEROQUEL) 25 MG tablet Take 50 mg by mouth at bedtime.     rizatriptan (MAXALT-MLT) 10 MG disintegrating tablet Take 10 mg by mouth daily as needed for migraine.     rosuvastatin (CRESTOR) 10 MG tablet Take 10 mg by mouth at bedtime.     glucose blood (ACCU-CHEK GUIDE) test strip Use to check blood 3 times a day 100 each 12   Insulin Aspart FlexPen (NOVOLOG) 100 UNIT/ML TAKE 10 UNITS PLUS SLIDING SCALE 30 mL 4   insulin degludec (TRESIBA FLEXTOUCH) 100 UNIT/ML FlexTouch Pen Inject 26 units daily 30 mL 4   No current facility-administered medications for this visit.    PHYSICAL EXAM: Vitals:   01/18/23 1547  BP: (!) 160/98  Pulse: 68  SpO2: 98%  Weight: 172 lb 6.4 oz (78.2 kg)  Height: 5\' 2"  (1.575 m)   Body mass index is 31.53 kg/m.  Wt Readings from Last 3 Encounters:  01/18/23 172 lb 6.4 oz (78.2 kg)  07/13/22 161 lb 12.8 oz (73.4 kg)  05/24/22 154 lb (69.9 kg)    General: Well developed, well nourished female in no apparent distress.  HEENT: AT/[place], no external lesions.  Eyes: Conjunctiva clear and no icterus. Neck: Neck supple  Lungs: Respirations not labored Neurologic: Alert, oriented, normal speech Extremities / Skin: Dry. No sores or rashes noted. No acanthosis nigricans Psychiatric: Does not appear depressed or anxious  Diabetic Foot Exam - Simple   No data filed    LABS Reviewed Lab Results  Component Value Date   HGBA1C 11.1 (A) 01/18/2023   HGBA1C 7.5 (A) 07/13/2022   HGBA1C 7.5 (A) 02/09/2022   Lab Results  Component Value Date   FRUCTOSAMINE 284 06/28/2022   FRUCTOSAMINE 417 (H) 09/23/2021   FRUCTOSAMINE 325 (H) 05/20/2021   Lab Results  Component Value Date   CHOL 201 (H)  02/09/2022   HDL 51 02/09/2022   LDLCALC 114 (H) 02/09/2022   TRIG 255 (H) 02/09/2022   CHOLHDL 3.9 02/09/2022   Lab Results  Component Value Date   MICRALBCREAT 12 02/09/2022   Lab Results  Component Value Date   CREATININE 1.08 (H) 06/28/2022   No results found for: "GFR"  ASSESSMENT / PLAN  1. Uncontrolled type 2 diabetes mellitus with hyperglycemia, with long-term current use of insulin (HCC)     Diabetes Mellitus type 2, complicated by no known complications - Diabetic status / severity: uncontrolled.   Lab Results  Component Value Date   HGBA1C 11.1 (A) 01/18/2023    - Hemoglobin A1c goal : <7.5%  Patient has worsening of diabetes mellitus.  She has significantly elevated blood sugar mostly related with meals with no enough coverage with insulin.  Adjusted insulin regimen as follows.  - Medications:  Diabetes regimen:  Increase tresiba to 26 units daily. Novolog: Take 10 units plus sliding scale with meals three times a day.  Sliding Scale Blood Glucose        Insulin 60-150                     None 151-200                   3 units 201-250                   5 units 251-300                   7 units 301-350                   9 units 351-400                  11 units    >400                       12 units and call provider  Has plan for colonoscopy next week:  Night before colonoscopy take tresiba 15 units only.  Do not take novolog when not eating.   - Home glucose testing: Dexcom G7 and check as needed. - Discussed/ Gave Hypoglycemia treatment plan.  # Patient CT abdomen in December 2023 showed mild diffuse atrophy of pancreas with coarse complication , reflect sequelae of chronic pancreatitis.  Patient does not recall having pancreatitis.  Patient was started on pancreatic enzyme Creon and following with gastroenterology.  Patient reports plan for further evaluation with MRI.   -I would like to check fasting C-peptide and glucose to check on  endogenous insulin production.  Patient will complete lab in about 4 weeks.  # Consult : not required at this time.   # Annual urine for microalbuminuria/ creatinine ratio, no microalbuminuria currentl. Last  Lab Results  Component Value Date   MICRALBCREAT 12 02/09/2022    # Foot check nightly / neuropathy.  # Annual dilated diabetic eye exams.   - Diet: Eat reasonable portion sizes to promote a healthy weight  2. Blood pressure  -  BP Readings from Last 1 Encounters:  01/18/23 (!) 160/98    - Control is in target.  - No change in current plans. -Managed by PCP  3. Lipid  status / Hyperlipidemia - Last  Lab Results  Component Value Date   LDLCALC 114 (H) 02/09/2022   - Continue atorvastatin 10 mg daily.  Emma Payne was seen today for follow-up.  Diagnoses and all orders for this visit:  Uncontrolled type 2 diabetes mellitus with hyperglycemia, with long-term current use of insulin (HCC) -     insulin degludec (TRESIBA FLEXTOUCH) 100 UNIT/ML FlexTouch Pen; Inject 26 units daily -     Discontinue: insulin aspart (NOVOLOG FLEXPEN) 100 UNIT/ML FlexPen; Take 10 units plus sliding scale with meals three times a day. Sliding Scale Blood Glucose        Insulin 60-150                     None 151-200                   3 units 201-250                   5 units 251-300                   7 units 301-350                   9 units 351-400                  11 units   >400                       12 units and call provider -     glucose blood (ACCU-CHEK GUIDE) test strip; Use to check blood 3 times a day -     C-peptide; Future -     Cancel: Glucose -     POCT glycosylated hemoglobin (Hb A1C) -     Glucose; Future   DISPOSITION Follow up in clinic in 6 weeks suggested.   All questions answered and patient verbalized understanding of the plan.  [place] Purnell Daigle, MD [facility] Endocrinology [facility] [address] [place], [address] [place], [place] [address] Phone #  [phone]  At least part of this note was generated using voice recognition software. Inadvertent word errors may have occurred, which were not recognized during the proofreading process.

## 2023-01-18 NOTE — Patient Instructions (Addendum)
Diabetes regimen:  Increase tresiba to 26 units daily. Novolog: Take 10 units plus sliding scale with meals three times a day.  Sliding Scale Blood Glucose        Insulin 60-150                     None 151-200                   3 units 201-250                   5 units 251-300                   7 units 301-350                   9 units 351-400                  11 units    >400                       12 units and call provider  Labs in 4 week.  Night before colonoscopy take tresiba 15 units only.  Do not take novolog when not eating.

## 2023-01-19 ENCOUNTER — Encounter: Payer: Self-pay | Admitting: Endocrinology

## 2023-01-26 ENCOUNTER — Encounter: Payer: Self-pay | Admitting: Endocrinology

## 2023-02-23 ENCOUNTER — Other Ambulatory Visit: Payer: No Typology Code available for payment source

## 2023-03-14 ENCOUNTER — Other Ambulatory Visit: Payer: No Typology Code available for payment source

## 2023-04-12 ENCOUNTER — Ambulatory Visit: Payer: No Typology Code available for payment source | Admitting: Endocrinology

## 2023-05-23 ENCOUNTER — Ambulatory Visit: Payer: No Typology Code available for payment source | Admitting: Endocrinology

## 2023-06-12 ENCOUNTER — Encounter: Payer: Self-pay | Admitting: Endocrinology

## 2023-06-14 ENCOUNTER — Other Ambulatory Visit: Payer: Self-pay

## 2023-06-14 DIAGNOSIS — E1165 Type 2 diabetes mellitus with hyperglycemia: Secondary | ICD-10-CM

## 2023-06-14 MED ORDER — DEXCOM G7 SENSOR MISC
1.0000 | 3 refills | Status: AC
Start: 2023-06-14 — End: ?
